# Patient Record
Sex: Female | Born: 1965
Health system: Southern US, Community
[De-identification: ages and names within clinical notes are randomized; demographics above are authoritative.]

## PROBLEM LIST (undated history)

## (undated) DIAGNOSIS — Z789 Other specified health status: Secondary | ICD-10-CM

## (undated) HISTORY — PX: NO PAST SURGERIES: SHX2092

---

## 1999-12-26 ENCOUNTER — Other Ambulatory Visit: Admission: RE | Admit: 1999-12-26 | Discharge: 1999-12-26 | Payer: Self-pay | Admitting: Family Medicine

## 2014-03-23 ENCOUNTER — Encounter (INDEPENDENT_AMBULATORY_CARE_PROVIDER_SITE_OTHER): Payer: Self-pay

## 2014-03-23 ENCOUNTER — Ambulatory Visit (INDEPENDENT_AMBULATORY_CARE_PROVIDER_SITE_OTHER): Payer: PRIVATE HEALTH INSURANCE | Admitting: Physician Assistant

## 2014-03-23 VITALS — BP 110/60 | HR 82 | Temp 96.8°F | Ht 60.0 in | Wt 108.8 lb

## 2014-03-23 DIAGNOSIS — H109 Unspecified conjunctivitis: Secondary | ICD-10-CM

## 2014-03-23 MED ORDER — TOBRAMYCIN 0.3 % OP SOLN: 2.0000 [drp] | Freq: Four times a day (QID) | OPHTHALMIC | Status: DC

## 2014-03-23 NOTE — Progress Notes (Signed)
Subjective:     Patient ID: Carrie BridgemanCathy Sutton Corkern, female   DOB: 06/24/1966, 48 y.o.   MRN: [id number]  HPI Redness and drainage to the L eye Pt works in a daycare setting  Review of Systems No visual changes + photophobia No pain    Objective:   Physical Exam PERRLA EOMI + erythema to the eyelids of the L eye No edema + matting to the lashes No pre-aur note    Assessment:     Conjunctivitis    Plan:     [employer] gtts [employer]q handwashing Work note F/U prn

## 2014-03-23 NOTE — Patient Instructions (Signed)

## 2015-12-18 ENCOUNTER — Encounter: Payer: PRIVATE HEALTH INSURANCE | Admitting: Family Medicine

## 2015-12-20 ENCOUNTER — Ambulatory Visit (INDEPENDENT_AMBULATORY_CARE_PROVIDER_SITE_OTHER): Payer: 59 | Admitting: Family Medicine

## 2015-12-20 ENCOUNTER — Encounter: Payer: Self-pay | Admitting: Family Medicine

## 2015-12-20 DIAGNOSIS — Z01419 Encounter for gynecological examination (general) (routine) without abnormal findings: Secondary | ICD-10-CM

## 2015-12-20 DIAGNOSIS — Z1211 Encounter for screening for malignant neoplasm of colon: Secondary | ICD-10-CM

## 2015-12-20 MED ORDER — CEPHALEXIN 500 MG PO CAPS: 500.0000 mg | ORAL_CAPSULE | Freq: Two times a day (BID) | ORAL | Status: DC

## 2015-12-20 NOTE — Progress Notes (Signed)
BP 130/86 mmHg  Pulse 77  Temp(Src) 98.4 F (36.9 C) (Oral)  Ht 5' (1.524 m)  Wt 106 lb 12.8 oz (48.444 kg)  BMI 20.86 kg/m2  LMP 03/14/2014   Subjective:    Patient ID: Carrie Mccoy, female    DOB: [birth year], 50 y.o.   MRN: [id number]  HPI: Carrie Mccoy is a 50 y.o. female presenting on 12/20/2015 for Gynecologic Exam   HPI Well woman exam Patient coming in today for well woman exam and gynecological exam. She has not had one in many years that she cannot even remember how long since his last one. She does not like coming to the doctor does not come very frequently. She would guess that it's been more than 10 years. She has never had an abnormal Pap but she's only had 2 in her lifetime. She denies any vaginal issues or pain or discharge or abnormal bleeding. She is not having periods anymore currently. She denies any chest pain, shortness of breath, headaches or vision issues, abdominal complaints, diarrhea, nausea, vomiting, or joint issues. She denies any issues with her breasts since his discharge or skin changes. She does say that she's always had fibrous breast tissue.  Relevant past medical, surgical, family and social history reviewed and updated as indicated. Interim medical history since our last visit reviewed. Allergies and medications reviewed and updated.  Review of Systems  Constitutional: Negative for fever and chills.  HENT: Negative for congestion, ear discharge and ear pain.   Eyes: Negative for redness and visual disturbance.  Respiratory: Negative for chest tightness and shortness of breath.   Cardiovascular: Negative for chest pain and leg swelling.  Genitourinary: Negative for dysuria, frequency, decreased urine volume, vaginal bleeding, vaginal discharge, difficulty urinating, genital sores, vaginal pain, menstrual problem and pelvic pain.  Musculoskeletal: Negative for back pain and gait problem.  Skin: Negative for color change and rash.    Neurological: Negative for light-headedness and headaches.  Psychiatric/Behavioral: Negative for behavioral problems and agitation.  All other systems reviewed and are negative.   Per HPI unless specifically indicated above     Medication List    Notice  As of 12/20/2015  3:56 PM   You have not been prescribed any medications.         Objective:    BP 130/86 mmHg  Pulse 77  Temp(Src) 98.4 F (36.9 C) (Oral)  Ht 5' (1.524 m)  Wt 106 lb 12.8 oz (48.444 kg)  BMI 20.86 kg/m2  LMP 03/14/2014  Wt Readings from Last 3 Encounters:  12/20/15 106 lb 12.8 oz (48.444 kg)  03/23/14 108 lb 12.8 oz (49.351 kg)    Physical Exam  Constitutional: She is oriented to person, place, and time. She appears well-developed and well-nourished. No distress.  HENT:  Right Ear: External ear normal.  Left Ear: External ear normal.  Nose: Nose normal.  Mouth/Throat: Oropharynx is clear and moist. No oropharyngeal exudate.  Eyes: Conjunctivae and EOM are normal. Pupils are equal, round, and reactive to light.  Neck: Neck supple. No thyromegaly present.  Cardiovascular: Normal rate, regular rhythm, normal heart sounds and intact distal pulses.   No murmur heard. Pulmonary/Chest: Effort normal and breath sounds normal. No respiratory distress. She has no wheezes. Right breast exhibits no inverted nipple, no mass, no nipple discharge, no skin change and no tenderness. Left breast exhibits no inverted nipple, no mass, no nipple discharge, no skin change and no tenderness. Breasts are symmetrical.  Abdominal: Soft. Bowel  sounds are normal. She exhibits no distension and no mass. There is no tenderness. There is no rebound and no guarding.  Genitourinary: Vagina normal and uterus normal. No breast swelling, tenderness, discharge or bleeding. Pelvic exam was performed with patient supine. There is no rash or lesion on the right labia. There is no rash or lesion on the left labia. Uterus is not deviated, not  enlarged, not fixed and not tender. Cervix exhibits no motion tenderness, no discharge and no friability. Right adnexum displays no mass and no tenderness. Left adnexum displays no mass and no tenderness.  Musculoskeletal: Normal range of motion. She exhibits no edema or tenderness.  Lymphadenopathy:    She has no cervical adenopathy.    She has no axillary adenopathy.  Neurological: She is alert and oriented to person, place, and time. Coordination normal.  Skin: Skin is warm and dry. No rash noted. She is not diaphoretic.  Psychiatric: She has a normal mood and affect. Her behavior is normal. Thought content normal.  Nursing note and vitals reviewed.   No results found for this or any previous visit.    Assessment & Plan:   Problem List Items Addressed This Visit    None    Visit Diagnoses    Well woman exam with routine gynecological exam        Relevant Orders    CMP14+EGFR    Lipid panel    Pap IG, rfx HPV all pth    Special screening for malignant neoplasms, colon        Relevant Orders    Ambulatory referral to Gastroenterology       Follow up plan: Return in about 1 year (around 12/19/2016), or if symptoms worsen or fail to improve.  Counseling provided for all of the vaccine components Orders Placed This Encounter  Procedures  . CMP14+EGFR  . Lipid panel  . Ambulatory referral to Gastroenterology    Caryl Pina, MD [facility] 12/20/2015, 3:56 PM

## 2015-12-21 LAB — LIPID PANEL
CHOLESTEROL TOTAL: 227 mg/dL — AB (ref 100–199)
Chol/HDL Ratio: 4.4 ratio units (ref 0.0–4.4)
HDL: 52 mg/dL (ref >39)
LDL Calculated: 127 mg/dL — ABNORMAL HIGH (ref 0–99)
TRIGLYCERIDES: 239 mg/dL — AB (ref 0–149)
VLDL Cholesterol Cal: 48 mg/dL — ABNORMAL HIGH (ref 5–40)

## 2015-12-21 LAB — CMP14+EGFR
A/G RATIO: 2.3 — AB (ref 1.2–2.2)
ALT: 12 IU/L (ref 0–32)
AST: 15 IU/L (ref 0–40)
Albumin: 4.9 g/dL (ref 3.5–5.5)
Alkaline Phosphatase: 67 IU/L (ref 39–117)
BILIRUBIN TOTAL: 0.3 mg/dL (ref 0.0–1.2)
BUN/Creatinine Ratio: 21 (ref 9–23)
BUN: 23 mg/dL (ref 6–24)
CHLORIDE: 101 mmol/L (ref 96–106)
CO2: 27 mmol/L (ref 18–29)
CREATININE: 1.08 mg/dL — AB (ref 0.57–1.00)
Calcium: 9.5 mg/dL (ref 8.7–10.2)
GFR calc Af Amer: 69 mL/min/{1.73_m2} (ref >59)
GFR calc non Af Amer: 60 mL/min/{1.73_m2} (ref >59)
GLOBULIN, TOTAL: 2.1 g/dL (ref 1.5–4.5)
Glucose: 86 mg/dL (ref 65–99)
Potassium: 4.5 mmol/L (ref 3.5–5.2)
SODIUM: 144 mmol/L (ref 134–144)
Total Protein: 7 g/dL (ref 6.0–8.5)

## 2015-12-23 LAB — PAP IG, RFX HPV ALL PTH: PAP Smear Comment: 0

## 2015-12-27 ENCOUNTER — Encounter: Payer: Self-pay | Admitting: *Deleted

## 2016-01-08 ENCOUNTER — Telehealth: Payer: Self-pay

## 2016-01-08 NOTE — Telephone Encounter (Signed)
Pt had received a triage letter from [facility]. Please call [phone]

## 2016-01-16 ENCOUNTER — Telehealth: Payer: Self-pay

## 2016-01-16 NOTE — Telephone Encounter (Signed)
LMOM to call.

## 2016-01-29 ENCOUNTER — Other Ambulatory Visit: Payer: Self-pay

## 2016-01-29 DIAGNOSIS — Z1211 Encounter for screening for malignant neoplasm of colon: Secondary | ICD-10-CM

## 2016-01-29 MED ORDER — SOD PICOSULFATE-MAG OX-CIT ACD 10-3.5-12 MG-GM-GM PO PACK: 1.0000 | PACK | ORAL | Status: DC

## 2016-01-29 NOTE — Telephone Encounter (Signed)
Rx sent to the pharmacy.

## 2016-01-29 NOTE — Telephone Encounter (Signed)
See separate triage.  

## 2016-01-29 NOTE — Telephone Encounter (Signed)
Gastroenterology Pre-Procedure Review  Request Date: 01/16/2016 Requesting Physician: Dr. Ivin BootyJoshua Dettinger  PATIENT REVIEW QUESTIONS: The patient responded to the following health history questions as indicated:    1. Diabetes Melitis: no 2. Joint replacements in the past 12 months: no 3. Major health problems in the past 3 months: no 4. Has an artificial valve or MVP: no 5. Has a defibrillator: no 6. Has been advised in past to take antibiotics in advance of a procedure like teeth cleaning: no 7. Family history of colon cancer: no  8. Alcohol Use: no 9. History of sleep apnea: no     MEDICATIONS & ALLERGIES:    Patient reports the following regarding taking any blood thinners:   Plavix? no Aspirin? no Coumadin? no  Patient confirms/reports the following medications:  Current Outpatient Prescriptions  Medication Sig Dispense Refill  . Multiple Vitamin (MULTIVITAMIN) tablet Take 1 tablet by mouth daily.    . NON FORMULARY Vitamin B    One daily    . NON FORMULARY Vitamin D    One daily     No current facility-administered medications for this visit.    Patient confirms/reports the following allergies:  No Known Allergies  No orders of the defined types were placed in this encounter.    AUTHORIZATION INFORMATION Primary Insurance:   ID #: Group #:  Pre-Cert / Auth required:  Pre-Cert / Auth #:   Secondary Insurance:   ID #:   Group #:  Pre-Cert / Auth required: Pre-Cert / Auth #:   SCHEDULE INFORMATION: Procedure has been scheduled as follows:  Date:  02/02/2016            Time: 1:15 PM  Location: [facility] Short Stay  This Gastroenterology Pre-Precedure Review Form is being routed to the following provider(s): Jonette EvaSandi Fields, MD

## 2016-01-29 NOTE — Telephone Encounter (Signed)
PREPOPIK-DRINK WATER TO KEEP URINE LIGHT YELLOW.  FULL LIQUIDS WITH BREAKFAST.  Full Liquid Diet A high-calorie, high-protein supplement should be used to meet your nutritional requirements when the full liquid diet is continued for more than 2 or 3 days. If this diet is to be used for an extended period of time (more than 7 days), a multivitamin should be considered.  Breads and Starches  Allowed: None are allowed    Avoid: Any others.    Potatoes/Pasta/Rice  Allowed: ANY ITEM AS A SOUP OR SMALL PLATE OF MASHED POTATOES OR RICE.       Vegetables  Allowed: Strained tomato or vegetable juice. Vegetables pureed in soup.   Avoid: Any others.    Fruit  Allowed: Any strained fruit juices and fruit drinks. Include 1 serving of citrus or vitamin C-enriched fruit juice daily.   Avoid: Any others.  Meat and Meat Substitutes  Allowed: Egg  Avoid: Any meat, fish, or fowl. All cheese.  Milk  Allowed: SOY Milk beverages, including milk shakes and instant breakfast mixes. Smooth yogurt.   Avoid: Any others. Avoid dairy products if not tolerated.    Soups and Combination Foods  Allowed: Broth, strained cream soups. Strained, broth-based soups.   Avoid: Any others.    Desserts and Sweets  Allowed: flavored gelatin, tapioca, plain LACTOSE FREE ice cream, sherbet, smooth pudding, junket, fruit ices, frozen ice pops, pudding pops,, frozen fudge pops, chocolate syrup. Sugar, honey, jelly, syrup.   Avoid: Any others.  Fats and Oils  Allowed: Margarine, butter, cream, sour cream, oils.   Avoid: Any others.  Beverages  Allowed: All.   Avoid: None.  Condiments  Allowed: Iodized salt, pepper, spices, flavorings. Cocoa powder.   Avoid: Any others.    SAMPLE MEAL PLAN Breakfast   cup orange juice.   1 cup cooked wheat cereal.   1 cup SOY milk.   1 cup beverage (coffee or tea).   Cream or sugar, if desired.    Midmorning Snack  2 SCRAMBLED OR HARD BOILED  EGG   Lunch  1 cup cream soup.    cup fruit juice.   1 cup SOY milk.    cup custard.   1 cup beverage (coffee or tea).   Cream or sugar, if desired.    Midafternoon Snack  1 cup VANILLA SOY milk shake.  Dinner  1 cup cream soup.    cup fruit juice.   1 cup SOY MILK milk.    cup pudding.   1 cup beverage (coffee or tea).   Cream or sugar, if desired.  Evening Snack  1 cup supplement.  To increase calories, add sugar, cream, butter, or margarine if possible. Nutritional supplements will also increase the total calories.;L;

## 2016-01-29 NOTE — Telephone Encounter (Signed)
Instructions mailed to pt. LMOM for a return call.  If pt needs this faxed to a number I will be glad to do so.

## 2016-01-30 ENCOUNTER — Telehealth: Payer: Self-pay

## 2016-01-30 NOTE — Telephone Encounter (Signed)
Pt to let us know if she needs it faxed.

## 2016-01-30 NOTE — Telephone Encounter (Signed)
I called [employer] @ [phone] and spoke to TerrilJoan I who said a PA is required for the screening colonoscopy as out patient.  Reference # [id number].

## 2016-01-31 ENCOUNTER — Telehealth: Payer: Self-pay

## 2016-01-31 NOTE — Telephone Encounter (Signed)
Pt called today because she does not have any instructions for her prep. She is set up for Friday. I went over the instructions with her and she understood them. She is going to be doing the split prep since her TCS is at 1:15.

## 2016-02-01 NOTE — Telephone Encounter (Signed)
Noted  

## 2016-02-02 ENCOUNTER — Encounter (HOSPITAL_COMMUNITY): Payer: Self-pay | Admitting: *Deleted

## 2016-02-02 ENCOUNTER — Ambulatory Visit (HOSPITAL_COMMUNITY)
Admission: RE | Admit: 2016-02-02 | Discharge: 2016-02-02 | Disposition: A | Discharge status: Home / Self Care | Payer: 59 | Source: Ambulatory Visit | Attending: Gastroenterology | Admitting: Gastroenterology

## 2016-02-02 ENCOUNTER — Encounter (HOSPITAL_COMMUNITY)
Admission: RE | Disposition: A | Discharge status: Home / Self Care | Payer: Self-pay | Source: Ambulatory Visit | Attending: Gastroenterology

## 2016-02-02 DIAGNOSIS — K648 Other hemorrhoids: Secondary | ICD-10-CM | POA: Insufficient documentation

## 2016-02-02 DIAGNOSIS — Z1211 Encounter for screening for malignant neoplasm of colon: Secondary | ICD-10-CM | POA: Insufficient documentation

## 2016-02-02 DIAGNOSIS — Q438 Other specified congenital malformations of intestine: Secondary | ICD-10-CM | POA: Diagnosis not present

## 2016-02-02 HISTORY — DX: Other specified health status: Z78.9

## 2016-02-02 HISTORY — PX: COLONOSCOPY: SHX5424

## 2016-02-02 SURGERY — COLONOSCOPY
Anesthesia: Moderate Sedation

## 2016-02-02 MED ORDER — SODIUM CHLORIDE 0.9 % IV SOLN
INTRAVENOUS | Status: DC
  Administered 2016-02-02: 1000 mL via INTRAVENOUS

## 2016-02-02 MED ORDER — MIDAZOLAM HCL 5 MG/5ML IJ SOLN
INTRAMUSCULAR | Status: DC | PRN
  Administered 2016-02-02 (×2): 2 mg via INTRAVENOUS
  Administered 2016-02-02: 1 mg via INTRAVENOUS

## 2016-02-02 MED ORDER — MIDAZOLAM HCL 5 MG/5ML IJ SOLN
INTRAMUSCULAR | Status: AC
  Filled 2016-02-02: qty 10

## 2016-02-02 MED ORDER — MEPERIDINE HCL 100 MG/ML IJ SOLN
INTRAMUSCULAR | Status: DC | PRN
  Administered 2016-02-02 (×2): 25 mg

## 2016-02-02 MED ORDER — MEPERIDINE HCL 100 MG/ML IJ SOLN
INTRAMUSCULAR | Status: AC
  Filled 2016-02-02: qty 2

## 2016-02-02 MED ORDER — STERILE WATER FOR IRRIGATION IR SOLN
Status: DC | PRN
  Administered 2016-02-02: 13:00:00

## 2016-02-02 NOTE — H&P (Signed)
  Primary Care Physician:  Nils PyleJoshua A Dettinger, MD Primary Gastroenterologist:  Dr. Darrick PennaFields  Pre-Procedure History & Physical: HPI:  Carrie Mccoy is a 50 y.o. female here for COLON CANCER SCREENING.  Past Medical History  Diagnosis Date  . Medical history non-contributory     Past Surgical History  Procedure Laterality Date  . No past surgeries      Prior to Admission medications   Medication Sig Start Date End Date Taking? Authorizing Provider  Sod Picosulfate-Mag Ox-Cit Acd 10-3.5-12 MG-GM-GM PACK Take 1 Container by mouth as directed. 01/29/16  Yes West BaliSandi L Kemal Amores, MD  Cyanocobalamin (VITAMIN B-12 PO) Take 1 tablet by mouth daily.    Historical Provider, MD  Multiple Vitamin (MULTIVITAMIN) tablet Take 1 tablet by mouth daily.    Historical Provider, MD  NON FORMULARY Vitamin B    One daily    Historical Provider, MD  NON FORMULARY Vitamin D    One daily    Historical Provider, MD  VITAMIN E PO Take 1 tablet by mouth daily.    Historical Provider, MD    Allergies as of 01/29/2016  . (No Known Allergies)    Family History  Problem Relation Age of Onset  . Hyperlipidemia Mother   . Heart disease Mother   . Heart disease Father   . Stroke Father   . Aneurysm Father     Social History   Social History  . Marital Status: Married    Spouse Name: N/A  . Number of Children: N/A  . Years of Education: N/A   Occupational History  . Not on file.   Social History Main Topics  . Smoking status: Never Smoker   . Smokeless tobacco: Never Used  . Alcohol Use: No  . Drug Use: No  . Sexual Activity: Not on file   Other Topics Concern  . Not on file   Social History Narrative    Review of Systems: See HPI, otherwise negative ROS   Physical Exam: BP 132/89 mmHg  Pulse 74  Temp(Src) 97.6 F (36.4 C) (Oral)  Resp 12  Ht 5' (1.524 m)  Wt 107 lb (48.535 kg)  BMI 20.90 kg/m2  SpO2 99%  LMP 03/14/2014 General:   Alert,  pleasant and cooperative in NAD Head:   Normocephalic and atraumatic. Neck:  Supple; Lungs:  Clear throughout to auscultation.    Heart:  Regular rate and rhythm. Abdomen:  Soft, nontender and nondistended. Normal bowel sounds, without guarding, and without rebound.   Neurologic:  Alert and  oriented x4;  grossly normal neurologically.  Impression/Plan:     SCREENING  Plan:  1. TCS TODAY

## 2016-02-02 NOTE — Discharge Instructions (Signed)
You have internal hemorrhoids. YOU DID NOT HAVE ANY POLYPS.    FOLLOW A HIGH FIBER DIET. AVOID ITEMS THAT CAUSE BLOATING. SEE INFO BELOW.  USE PREPARATION H 2 TO 4 TIMES A DAY AS NEEDED FOR RECTAL PRESSURE/PAIN/BLEEDING.  Next colonoscopy in 10 years.   Colonoscopy Care After Read the instructions outlined below and refer to this sheet in the next week. These discharge instructions provide you with general information on caring for yourself after you leave the hospital. While your treatment has been planned according to the most current medical practices available, unavoidable complications occasionally occur. If you have any problems or questions after discharge, call DR. Schneur Crowson, [phone].  ACTIVITY  You may resume your regular activity, but move at a slower pace for the next 24 hours.   Take frequent rest periods for the next 24 hours.   Walking will help get rid of the air and reduce the bloated feeling in your belly (abdomen).   No driving for 24 hours (because of the medicine (anesthesia) used during the test).   You may shower.   Do not sign any important legal documents or operate any machinery for 24 hours (because of the anesthesia used during the test).    NUTRITION  Drink plenty of fluids.   You may resume your normal diet as instructed by your doctor.   Begin with a light meal and progress to your normal diet. Heavy or fried foods are harder to digest and may make you feel sick to your stomach (nauseated).   Avoid alcoholic beverages for 24 hours or as instructed.    MEDICATIONS  You may resume your normal medications.   WHAT YOU CAN EXPECT TODAY  Some feelings of bloating in the abdomen.   Passage of more gas than usual.   Spotting of blood in your stool or on the toilet paper  .  IF YOU HAD POLYPS REMOVED DURING THE COLONOSCOPY:  Eat a soft diet IF YOU HAVE NAUSEA, BLOATING, ABDOMINAL PAIN, OR VOMITING.    FINDING OUT THE RESULTS OF YOUR  TEST Not all test results are available during your visit. DR. Darrick PennaFIELDS WILL CALL YOU WITHIN 7 DAYS OF YOUR PROCEDUE WITH YOUR RESULTS. Do not assume everything is normal if you have not heard from DR. Rahmir Beever IN ONE WEEK, CALL HER OFFICE AT [phone].  SEEK IMMEDIATE MEDICAL ATTENTION AND CALL THE OFFICE: [phone] IF:  You have more than a spotting of blood in your stool.   Your belly is swollen (abdominal distention).   You are nauseated or vomiting.   You have a temperature over 101F.   You have abdominal pain or discomfort that is severe or gets worse throughout the day.  High-Fiber Diet A high-fiber diet changes your normal diet to include more whole grains, legumes, fruits, and vegetables. Changes in the diet involve replacing refined carbohydrates with unrefined foods. The calorie level of the diet is essentially unchanged. The Dietary Reference Intake (recommended amount) for adult males is 38 grams per day. For adult females, it is 25 grams per day. Pregnant and lactating women should consume 28 grams of fiber per day. Fiber is the intact part of a plant that is not broken down during digestion. Functional fiber is fiber that has been isolated from the plant to provide a beneficial effect in the body. PURPOSE  Increase stool bulk.   Ease and regulate bowel movements.   Lower cholesterol.  INDICATIONS THAT YOU NEED MORE FIBER  Constipation and hemorrhoids.  Uncomplicated diverticulosis (intestine condition) and irritable bowel syndrome.  °· Weight management.  °· As a protective measure against hardening of the arteries (atherosclerosis), diabetes, and cancer.  ° °GUIDELINES FOR INCREASING FIBER IN THE DIET °· Start adding fiber to the diet slowly. A gradual increase of about 5 more grams (2 slices of whole-wheat bread, 2 servings of most fruits or vegetables, or 1 bowl of high-fiber cereal) per day is best. Too rapid an increase in fiber may result in constipation,  flatulence, and bloating.  °· Drink enough water and fluids to keep your urine clear or pale yellow. Water, juice, or caffeine-free drinks are recommended. Not drinking enough fluid may cause constipation.  °· Eat a variety of high-fiber foods rather than one type of fiber.  °· Try to increase your intake of fiber through using high-fiber foods rather than fiber pills or supplements that contain small amounts of fiber.  °· The goal is to change the types of food eaten. Do not supplement your present diet with high-fiber foods, but replace foods in your present diet.  °INCLUDE A VARIETY OF FIBER SOURCES °· Replace refined and processed grains with whole grains, canned fruits with fresh fruits, and incorporate other fiber sources. White rice, white breads, and most bakery goods contain little or no fiber.  °· Brown whole-grain rice, buckwheat oats, and many fruits and vegetables are all good sources of fiber. These include: broccoli, Brussels sprouts, cabbage, cauliflower, beets, sweet potatoes, white potatoes (skin on), carrots, tomatoes, eggplant, squash, berries, fresh fruits, and dried fruits.  °· Cereals appear to be the richest source of fiber. Cereal fiber is found in whole grains and bran. Bran is the fiber-rich outer coat of cereal grain, which is largely removed in refining. In whole-grain cereals, the bran remains. In breakfast cereals, the largest amount of fiber is found in those with "bran" in their names. The fiber content is sometimes indicated on the label.  °· You may need to include additional fruits and vegetables each day.  °· In baking, for 1 cup white flour, you may use the following substitutions:  °· 1 cup whole-wheat flour minus 2 tablespoons.  °· 1/2 cup white flour plus 1/2 cup whole-wheat flour.  ° °Hemorrhoids °Hemorrhoids are dilated (enlarged) veins around the rectum. Sometimes clots will form in the veins. This makes them swollen and painful. These are called thrombosed  hemorrhoids. °Causes of hemorrhoids include: °· Constipation.  °· Straining to have a bowel movement. °·  HEAVY LIFTING °HOME CARE INSTRUCTIONS °· Eat a well balanced diet and drink 6 to 8 glasses of water every day to avoid constipation. You may also use a bulk laxative.  °· Avoid straining to have bowel movements.  °· Keep anal area dry and clean.  °· Do not use a donut shaped pillow or sit on the toilet for long periods. This increases blood pooling and pain.  °· Move your bowels when your body has the urge; this will require less straining and will decrease pain and pressure.  ° °

## 2016-02-03 NOTE — Op Note (Signed)
[facility] Patient Name: Carrie Mccoy Procedure Date: 02/02/2016 1:15 PM MRN: [id number] Date of Birth: [birth year] Attending MD: Jonette Eva , MD CSN: [id number] Age: 50 Admit Type: Outpatient Procedure:                Colonoscopy Indications:              Screening for colorectal malignant neoplasm Providers:                Jonette Eva, MD, Jannett Celestine, RN, Birder Robson,                            Technician Referring MD:              Medicines:                Meperidine 50 mg IV, Midazolam 5 mg IV Complications:            No immediate complications. Estimated Blood Loss:     Estimated blood loss was minimal. Procedure:                Pre-Anesthesia Assessment:                           - Prior to the procedure, a History and Physical                            was performed, and patient medications and                            allergies were reviewed. The patient's tolerance of                            previous anesthesia was also reviewed. The risks                            and benefits of the procedure and the sedation                            options and risks were discussed with the patient.                            All questions were answered, and informed consent                            was obtained. Prior Anticoagulants: The patient has                            taken no previous anticoagulant or antiplatelet                            agents. ASA Grade Assessment: I - A normal, healthy                            patient. After reviewing the risks and benefits,  the patient was deemed in satisfactory condition to                            undergo the procedure.                           After obtaining informed consent, the colonoscope                            was passed under direct vision. Throughout the                            procedure, the patient's blood pressure, pulse, and                            oxygen  saturations were monitored continuously. The                            [id number] ([id number]) scope was introduced through                            the anus and advanced to the the cecum, identified                            by appendiceal orifice and ileocecal valve. The                            ileocecal valve, appendiceal orifice, and rectum                            were photographed. The colonoscopy was somewhat                            difficult due to a tortuous colon. Successful                            completion of the procedure was aided by increasing                            the dose of sedation medication. The patient                            tolerated the procedure well. The quality of the                            bowel preparation was excellent. Scope In: 1:34:06 PM Scope Out: 1:47:16 PM Scope Withdrawal Time: 0 hours 9 minutes 34 seconds  Total Procedure Duration: 0 hours 13 minutes 10 seconds  Findings:      The recto-sigmoid colon was mildly redundant. Advancing the scope       required straightening and shortening the scope to obtain bowel loop       reduction.      Non-bleeding internal hemorrhoids were found. The hemorrhoids were small. Impression:               -  Redundant colon.                           - Non-bleeding internal hemorrhoids.                           - No specimens collected. Moderate Sedation:      Moderate (conscious) sedation was administered by the endoscopy nurse       and supervised by the endoscopist. The following parameters were       monitored: oxygen saturation, heart rate, blood pressure, and response       to care. Total physician intraservice time was 26 minutes. Recommendation:           - Repeat colonoscopy in 10 years for surveillance.                           - High fiber diet.                           - Continue present medications.                           - Patient has a contact number available for                             emergencies. The signs and symptoms of potential                            delayed complications were discussed with the                            patient. Return to normal activities tomorrow.                            Written discharge instructions were provided to the                            patient. Procedure Code(s):        --- Professional ---                           Y7829G0121, Colorectal cancer screening; colonoscopy on                            individual not meeting criteria for high risk                           99153, Moderate sedation services; each additional                            15 minutes intraservice time                           G0500, Moderate sedation services provided by the  same physician or other qualified health care                            professional performing a gastrointestinal                            endoscopic service that sedation supports,                            requiring the presence of an independent trained                            observer to assist in the monitoring of the                            patient's level of consciousness and physiological                            status; initial 15 minutes of intra-service time;                            patient age 3 years or older (additional time may                            be reported with 09604, as appropriate) Diagnosis Code(s):        --- Professional ---                           Z12.11, Encounter for screening for malignant                            neoplasm of colon                           K64.8, Other hemorrhoids                           Q43.8, Other specified congenital malformations of                            intestine CPT copyright 2016 [employer]. All rights reserved. The codes documented in this report are preliminary and upon coder review may  be revised to meet current compliance  requirements. Jonette Eva, MD Jonette Eva, MD 02/02/2016 11:23:42 PM This report has been signed electronically. Number of Addenda: 0

## 2016-02-07 ENCOUNTER — Encounter (HOSPITAL_COMMUNITY): Payer: Self-pay | Admitting: Gastroenterology

## 2016-03-18 ENCOUNTER — Encounter: Payer: PRIVATE HEALTH INSURANCE | Admitting: *Deleted

## 2016-11-08 ENCOUNTER — Telehealth: Payer: Self-pay

## 2016-11-18 NOTE — Telephone Encounter (Signed)
x

## 2017-06-27 ENCOUNTER — Ambulatory Visit (INDEPENDENT_AMBULATORY_CARE_PROVIDER_SITE_OTHER): Payer: 59 | Admitting: Family Medicine

## 2017-06-27 ENCOUNTER — Encounter: Payer: Self-pay | Admitting: Family Medicine

## 2017-06-27 VITALS — BP 125/86 | HR 76 | Temp 98.0°F | Ht 60.0 in | Wt 111.0 lb

## 2017-06-27 DIAGNOSIS — Z13220 Encounter for screening for lipoid disorders: Secondary | ICD-10-CM | POA: Diagnosis not present

## 2017-06-27 DIAGNOSIS — Z131 Encounter for screening for diabetes mellitus: Secondary | ICD-10-CM

## 2017-06-27 DIAGNOSIS — Z0000 Encounter for general adult medical examination without abnormal findings: Secondary | ICD-10-CM | POA: Diagnosis not present

## 2017-06-27 NOTE — Progress Notes (Signed)
BP 125/86   Pulse 76   Temp 98 F (36.7 C) (Oral)   Ht 5' (1.524 m)   Wt 111 lb (50.3 kg)   LMP 03/14/2014   BMI 21.68 kg/m    Subjective:    Patient ID: Carrie Mccoy, female    DOB: October 16, 1965, 51 y.o.   MRN: [id number]  HPI: Carrie Mccoy is a 51 y.o. female presenting on 06/27/2017 for Annual Exam   HPI Annual well adult exam and physical Patient is coming in today for adult well exam and physical and fasting labs. She is not due for Pap smear this year because she has had one just last year. She also had a mammogram just year and half ago which was normal. She is due for that soon as well. She denies any major health issues. Patient denies any chest pain, shortness of breath, headaches or vision issues, abdominal complaints, diarrhea, nausea, vomiting, or joint issues.   Relevant past medical, surgical, family and social history reviewed and updated as indicated. Interim medical history since our last visit reviewed. Allergies and medications reviewed and updated.  Review of Systems  Constitutional: Negative for chills and fever.  HENT: Negative for congestion, ear discharge, ear pain and tinnitus.   Eyes: Negative for pain, redness and visual disturbance.  Respiratory: Negative for cough, chest tightness, shortness of breath and wheezing.   Cardiovascular: Negative for chest pain, palpitations and leg swelling.  Gastrointestinal: Negative for abdominal pain, blood in stool, constipation and diarrhea.  Genitourinary: Negative for difficulty urinating, dysuria and hematuria.  Musculoskeletal: Negative for back pain, gait problem and myalgias.  Skin: Negative for rash.  Neurological: Negative for dizziness, weakness, light-headedness and headaches.  Psychiatric/Behavioral: Negative for agitation, behavioral problems and suicidal ideas.  All other systems reviewed and are negative.   Per HPI unless specifically indicated above        Objective:    BP 125/86    Pulse 76   Temp 98 F (36.7 C) (Oral)   Ht 5' (1.524 m)   Wt 111 lb (50.3 kg)   LMP 03/14/2014   BMI 21.68 kg/m   Wt Readings from Last 3 Encounters:  06/27/17 111 lb (50.3 kg)  02/02/16 107 lb (48.5 kg)  12/20/15 106 lb 12.8 oz (48.4 kg)    Physical Exam  Constitutional: She is oriented to person, place, and time. She appears well-developed and well-nourished. No distress.  Eyes: Conjunctivae are normal.  Neck: Neck supple. No thyromegaly present.  Cardiovascular: Normal rate, regular rhythm, normal heart sounds and intact distal pulses.   No murmur heard. Pulmonary/Chest: Effort normal and breath sounds normal. No respiratory distress. She has no wheezes.  Abdominal: Soft. Bowel sounds are normal. She exhibits no distension. There is no tenderness. There is no rebound.  Musculoskeletal: Normal range of motion. She exhibits no edema or tenderness.  Lymphadenopathy:    She has no cervical adenopathy.  Neurological: She is alert and oriented to person, place, and time. Coordination normal.  Skin: Skin is warm and dry. Lesion (Seborrheic keratosis on midaxillary line on the left thorax) noted. No rash noted. She is not diaphoretic.  Psychiatric: She has a normal mood and affect. Her behavior is normal.  Nursing note and vitals reviewed.       Assessment & Plan:   Problem List Items Addressed This Visit    None    Visit Diagnoses    Well adult exam    -  Primary  Relevant Orders   CMP14+EGFR   CBC with Differential/Platelet   Lipid panel   Lipid screening       Relevant Orders   Lipid panel   Diabetes mellitus screening       Relevant Orders   CMP14+EGFR       Follow up plan: Return in about 1 year (around 06/27/2018), or if symptoms worsen or fail to improve.  Counseling provided for all of the vaccine components Orders Placed This Encounter  Procedures  . CMP14+EGFR  . CBC with Differential/Platelet  . Lipid panel    Caryl Pina, MD [facility] 06/27/2017, 3:20 PM

## 2017-06-28 LAB — CBC WITH DIFFERENTIAL/PLATELET
BASOS ABS: 0 10*3/uL (ref 0.0–0.2)
Basos: 0 %
EOS (ABSOLUTE): 0.1 10*3/uL (ref 0.0–0.4)
Eos: 2 %
HEMOGLOBIN: 12.4 g/dL (ref 11.1–15.9)
Hematocrit: 36.4 % (ref 34.0–46.6)
Immature Grans (Abs): 0 10*3/uL (ref 0.0–0.1)
Immature Granulocytes: 0 %
LYMPHS ABS: 2 10*3/uL (ref 0.7–3.1)
Lymphs: 32 %
MCH: 29.5 pg (ref 26.6–33.0)
MCHC: 34.1 g/dL (ref 31.5–35.7)
MCV: 87 fL (ref 79–97)
MONOCYTES: 8 %
Monocytes Absolute: 0.5 10*3/uL (ref 0.1–0.9)
NEUTROS PCT: 58 %
Neutrophils Absolute: 3.7 10*3/uL (ref 1.4–7.0)
Platelets: 206 10*3/uL (ref 150–379)
RBC: 4.21 x10E6/uL (ref 3.77–5.28)
RDW: 13.1 % (ref 12.3–15.4)
WBC: 6.3 10*3/uL (ref 3.4–10.8)

## 2017-06-28 LAB — CMP14+EGFR
ALK PHOS: 70 IU/L (ref 39–117)
ALT: 6 IU/L (ref 0–32)
AST: 13 IU/L (ref 0–40)
Albumin/Globulin Ratio: 2.3 — ABNORMAL HIGH (ref 1.2–2.2)
Albumin: 4.5 g/dL (ref 3.5–5.5)
BILIRUBIN TOTAL: 0.3 mg/dL (ref 0.0–1.2)
BUN/Creatinine Ratio: 24 — ABNORMAL HIGH (ref 9–23)
BUN: 18 mg/dL (ref 6–24)
CHLORIDE: 105 mmol/L (ref 96–106)
CO2: 26 mmol/L (ref 20–29)
Calcium: 9.2 mg/dL (ref 8.7–10.2)
Creatinine, Ser: 0.75 mg/dL (ref 0.57–1.00)
GFR calc Af Amer: 107 mL/min/{1.73_m2} (ref >59)
GFR calc non Af Amer: 93 mL/min/{1.73_m2} (ref >59)
GLUCOSE: 84 mg/dL (ref 65–99)
Globulin, Total: 2 g/dL (ref 1.5–4.5)
Potassium: 4.2 mmol/L (ref 3.5–5.2)
Sodium: 146 mmol/L — ABNORMAL HIGH (ref 134–144)
Total Protein: 6.5 g/dL (ref 6.0–8.5)

## 2017-06-28 LAB — LIPID PANEL
CHOL/HDL RATIO: 4.6 ratio — AB (ref 0.0–4.4)
CHOLESTEROL TOTAL: 215 mg/dL — AB (ref 100–199)
HDL: 47 mg/dL (ref >39)
LDL Calculated: 117 mg/dL — ABNORMAL HIGH (ref 0–99)
TRIGLYCERIDES: 253 mg/dL — AB (ref 0–149)
VLDL Cholesterol Cal: 51 mg/dL — ABNORMAL HIGH (ref 5–40)

## 2017-06-30 ENCOUNTER — Telehealth: Payer: Self-pay | Admitting: Family Medicine

## 2017-06-30 NOTE — Telephone Encounter (Signed)
Patient notified. See lab note 

## 2018-06-29 ENCOUNTER — Encounter: Payer: Self-pay | Admitting: Family Medicine

## 2018-06-29 ENCOUNTER — Ambulatory Visit (INDEPENDENT_AMBULATORY_CARE_PROVIDER_SITE_OTHER): Payer: 59 | Admitting: Family Medicine

## 2018-06-29 VITALS — BP 130/80 | HR 83 | Temp 97.5°F | Ht 60.0 in | Wt 110.6 lb

## 2018-06-29 DIAGNOSIS — R5383 Other fatigue: Secondary | ICD-10-CM

## 2018-06-29 DIAGNOSIS — E785 Hyperlipidemia, unspecified: Secondary | ICD-10-CM | POA: Insufficient documentation

## 2018-06-29 DIAGNOSIS — E782 Mixed hyperlipidemia: Secondary | ICD-10-CM

## 2018-06-29 DIAGNOSIS — Z0000 Encounter for general adult medical examination without abnormal findings: Secondary | ICD-10-CM | POA: Diagnosis not present

## 2018-06-29 NOTE — Progress Notes (Signed)
BP 130/80   Pulse 83   Temp (!) 97.5 F (36.4 C) (Oral)   Ht 5' (1.524 m)   Wt 110 lb 9.6 oz (50.2 kg)   LMP 03/14/2014   BMI 21.60 kg/m    Subjective:    Patient ID: Carrie Mccoy, female    DOB: [birth year], 52 y.o.   MRN: [id number]  HPI: Carrie Mccoy is a 52 y.o. female presenting on 06/29/2018 for Annual Exam   HPI Well adult exam and physical Patient is coming in today for well adult exam and physical.  She says she gets an occasional fatigue every now and then that she thinks could be related to taking all her vitamins but does not know for sure but is not every day and has not all the time.  She says she sleeps well and she feels fine on the days that she does not take her vitamins she thinks.  She denies any other factors that have played into it.  She currently works still at a daycare and enjoys it very much and stays active that way.  She denies any other health issues or concerns. Patient denies any chest pain, shortness of breath, headaches or vision issues, abdominal complaints, diarrhea, nausea, vomiting, or joint issues.   Relevant past medical, surgical, family and social history reviewed and updated as indicated. Interim medical history since our last visit reviewed. Allergies and medications reviewed and updated.  Review of Systems  Constitutional: Negative for chills and fever.  HENT: Negative for congestion, ear discharge, ear pain and tinnitus.   Eyes: Negative for pain, redness and visual disturbance.  Respiratory: Negative for cough, chest tightness, shortness of breath and wheezing.   Cardiovascular: Negative for chest pain, palpitations and leg swelling.  Gastrointestinal: Negative for abdominal pain, blood in stool, constipation and diarrhea.  Genitourinary: Negative for difficulty urinating, dysuria and hematuria.  Musculoskeletal: Negative for back pain, gait problem and myalgias.  Skin: Negative for rash.  Neurological: Negative for dizziness,  weakness, light-headedness and headaches.  Psychiatric/Behavioral: Negative for agitation, behavioral problems and suicidal ideas.  All other systems reviewed and are negative.   Per HPI unless specifically indicated above   Allergies as of 06/29/2018   No Known Allergies     Medication List        Accurate as of 06/29/18  3:31 PM. Always use your most recent med list.          multivitamin tablet Take 1 tablet by mouth daily.   NON FORMULARY Vitamin B    One daily   NON FORMULARY Vitamin D    One daily   VITAMIN B-12 PO Take 1 tablet by mouth daily.   VITAMIN E PO Take 1 tablet by mouth daily.          Objective:    BP 130/80   Pulse 83   Temp (!) 97.5 F (36.4 C) (Oral)   Ht 5' (1.524 m)   Wt 110 lb 9.6 oz (50.2 kg)   LMP 03/14/2014   BMI 21.60 kg/m   Wt Readings from Last 3 Encounters:  06/29/18 110 lb 9.6 oz (50.2 kg)  06/27/17 111 lb (50.3 kg)  02/02/16 107 lb (48.5 kg)    Physical Exam  Constitutional: She is oriented to person, place, and time. She appears well-developed and well-nourished. No distress.  Eyes: Conjunctivae are normal.  Neck: Neck supple. No thyromegaly present.  Cardiovascular: Normal rate, regular rhythm, normal heart sounds and intact distal  pulses.  No murmur heard. Pulmonary/Chest: Effort normal and breath sounds normal. No respiratory distress. She has no wheezes.  Musculoskeletal: Normal range of motion. She exhibits no edema or tenderness.  Lymphadenopathy:    She has no cervical adenopathy.  Neurological: She is alert and oriented to person, place, and time. Coordination normal.  Skin: Skin is warm and dry. No rash noted. She is not diaphoretic.  Psychiatric: She has a normal mood and affect. Her behavior is normal.  Nursing note and vitals reviewed.       Assessment & Plan:   Problem List Items Addressed This Visit      Other   Hyperlipidemia    Other Visit Diagnoses    Well adult exam    -  Primary    Relevant Orders   BMP8+EGFR   Lipid panel   Other fatigue       Relevant Orders   CBC with Differential/Platelet   TSH       Follow up plan: Return in about 1 year (around 06/30/2019), or if symptoms worsen or fail to improve, for Well adult exam.  Counseling provided for all of the vaccine components Orders Placed This Encounter  Procedures  . CBC with Differential/Platelet  . BMP8+EGFR  . Lipid panel  . TSH    Caryl Pina, MD [facility] 06/29/2018, 3:31 PM

## 2018-06-30 LAB — CBC WITH DIFFERENTIAL/PLATELET
BASOS ABS: 0 10*3/uL (ref 0.0–0.2)
Basos: 0 %
EOS (ABSOLUTE): 0.1 10*3/uL (ref 0.0–0.4)
Eos: 1 %
Hematocrit: 39.5 % (ref 34.0–46.6)
Hemoglobin: 12.9 g/dL (ref 11.1–15.9)
IMMATURE GRANS (ABS): 0 10*3/uL (ref 0.0–0.1)
IMMATURE GRANULOCYTES: 0 %
LYMPHS: 35 %
Lymphocytes Absolute: 2.1 10*3/uL (ref 0.7–3.1)
MCH: 28.2 pg (ref 26.6–33.0)
MCHC: 32.7 g/dL (ref 31.5–35.7)
MCV: 86 fL (ref 79–97)
MONOCYTES: 7 %
Monocytes Absolute: 0.4 10*3/uL (ref 0.1–0.9)
NEUTROS ABS: 3.2 10*3/uL (ref 1.4–7.0)
NEUTROS PCT: 57 %
PLATELETS: 222 10*3/uL (ref 150–450)
RBC: 4.57 x10E6/uL (ref 3.77–5.28)
RDW: 12 % — ABNORMAL LOW (ref 12.3–15.4)
WBC: 5.8 10*3/uL (ref 3.4–10.8)

## 2018-06-30 LAB — LIPID PANEL
CHOL/HDL RATIO: 5.3 ratio — AB (ref 0.0–4.4)
CHOLESTEROL TOTAL: 219 mg/dL — AB (ref 100–199)
HDL: 41 mg/dL (ref >39)
LDL CALC: 126 mg/dL — AB (ref 0–99)
Triglycerides: 258 mg/dL — ABNORMAL HIGH (ref 0–149)
VLDL Cholesterol Cal: 52 mg/dL — ABNORMAL HIGH (ref 5–40)

## 2018-06-30 LAB — BMP8+EGFR
BUN / CREAT RATIO: 22 (ref 9–23)
BUN: 19 mg/dL (ref 6–24)
CO2: 27 mmol/L (ref 20–29)
CREATININE: 0.88 mg/dL (ref 0.57–1.00)
Calcium: 9.5 mg/dL (ref 8.7–10.2)
Chloride: 104 mmol/L (ref 96–106)
GFR calc non Af Amer: 76 mL/min/{1.73_m2} (ref >59)
GFR, EST AFRICAN AMERICAN: 87 mL/min/{1.73_m2} (ref >59)
GLUCOSE: 104 mg/dL — AB (ref 65–99)
Potassium: 4.2 mmol/L (ref 3.5–5.2)
SODIUM: 143 mmol/L (ref 134–144)

## 2018-06-30 LAB — TSH: TSH: 4.09 u[IU]/mL (ref 0.450–4.500)

## 2018-08-18 DIAGNOSIS — Z1231 Encounter for screening mammogram for malignant neoplasm of breast: Secondary | ICD-10-CM | POA: Diagnosis not present

## 2018-08-18 LAB — HM MAMMOGRAPHY

## 2019-02-25 ENCOUNTER — Telehealth: Payer: Self-pay | Admitting: Family Medicine

## 2019-10-07 ENCOUNTER — Encounter: Payer: 59 | Admitting: Family Medicine

## 2019-10-22 ENCOUNTER — Other Ambulatory Visit: Payer: Self-pay

## 2019-10-22 ENCOUNTER — Encounter: Payer: Self-pay | Admitting: Family Medicine

## 2019-10-22 ENCOUNTER — Ambulatory Visit (INDEPENDENT_AMBULATORY_CARE_PROVIDER_SITE_OTHER): Payer: 59 | Admitting: Family Medicine

## 2019-10-22 VITALS — BP 131/75 | HR 78 | Temp 99.3°F | Ht 60.0 in | Wt 111.0 lb

## 2019-10-22 DIAGNOSIS — Z0000 Encounter for general adult medical examination without abnormal findings: Secondary | ICD-10-CM

## 2019-10-22 DIAGNOSIS — E782 Mixed hyperlipidemia: Secondary | ICD-10-CM | POA: Diagnosis not present

## 2019-10-22 NOTE — Progress Notes (Signed)
BP 131/75   Pulse 78   Temp 99.3 F (37.4 C) (Temporal)   Ht 5' (1.524 m)   Wt 111 lb (50.3 kg)   LMP 03/14/2014   BMI 21.68 kg/m    Subjective:   Patient ID: Carrie Mccoy, female    DOB: Nov 10, 1965, 54 y.o.   MRN: [id number]  HPI: Carrie Mccoy is a 54 y.o. female presenting on 10/22/2019 for Annual Exam   HPI .Well exam and physical Patient is coming in today for well adult exam and physical.  She denies any major health issues or concerns and feels like she is doing pretty well. Patient denies any chest pain, shortness of breath, headaches or vision issues, abdominal complaints, diarrhea, nausea, vomiting, or joint issues.   Relevant past medical, surgical, family and social history reviewed and updated as indicated. Interim medical history since our last visit reviewed. Allergies and medications reviewed and updated.  Review of Systems  Constitutional: Negative for chills and fever.  HENT: Negative for congestion, ear discharge, ear pain and tinnitus.   Eyes: Negative for pain, redness and visual disturbance.  Respiratory: Negative for cough, chest tightness, shortness of breath and wheezing.   Cardiovascular: Negative for chest pain, palpitations and leg swelling.  Gastrointestinal: Negative for abdominal pain, blood in stool, constipation and diarrhea.  Genitourinary: Negative for difficulty urinating, dysuria and hematuria.  Musculoskeletal: Negative for back pain, gait problem and myalgias.  Skin: Negative for rash.  Neurological: Negative for dizziness, weakness, light-headedness and headaches.  Psychiatric/Behavioral: Negative for agitation, behavioral problems and suicidal ideas.  All other systems reviewed and are negative.   Per HPI unless specifically indicated above   Allergies as of 10/22/2019   No Known Allergies     Medication List       Accurate as of October 22, 2019  4:36 PM. If you have any questions, ask your nurse or doctor.        FISH  OIL CONCENTRATE PO Take by mouth.   multivitamin tablet Take 1 tablet by mouth daily.   NON FORMULARY Vitamin B    One daily   NON FORMULARY Vitamin D    One daily   VITAMIN B-12 PO Take 1 tablet by mouth daily.   VITAMIN E PO Take 1 tablet by mouth daily.        Objective:   BP 131/75   Pulse 78   Temp 99.3 F (37.4 C) (Temporal)   Ht 5' (1.524 m)   Wt 111 lb (50.3 kg)   LMP 03/14/2014   BMI 21.68 kg/m   Wt Readings from Last 3 Encounters:  10/22/19 111 lb (50.3 kg)  06/29/18 110 lb 9.6 oz (50.2 kg)  06/27/17 111 lb (50.3 kg)    Physical Exam Vitals and nursing note reviewed.  Constitutional:      General: She is not in acute distress.    Appearance: She is well-developed. She is not diaphoretic.  Eyes:     Conjunctiva/sclera: Conjunctivae normal.  Cardiovascular:     Rate and Rhythm: Normal rate and regular rhythm.     Heart sounds: Normal heart sounds. No murmur.  Pulmonary:     Effort: Pulmonary effort is normal. No respiratory distress.     Breath sounds: Normal breath sounds. No wheezing.  Musculoskeletal:        General: No tenderness. Normal range of motion.  Skin:    General: Skin is warm and dry.     Findings: No rash.  Neurological:     Mental Status: She is alert and oriented to person, place, and time.     Coordination: Coordination normal.  Psychiatric:        Behavior: Behavior normal.       Assessment & Plan:   Problem List Items Addressed This Visit      Other   Hyperlipidemia   Relevant Orders   Lipid panel    Other Visit Diagnoses    Well adult exam    -  Primary   Relevant Orders   CBC with Differential/Platelet   CMP14+EGFR   Lipid panel   TSH      Continue fish oil will check blood work today and continue see yearly unless she is have any major issues, she due for Pap smear next year Follow up plan: Return in about 1 year (around 10/21/2020), or if symptoms worsen or fail to improve, for Adult well  exam.  Counseling provided for all of the vaccine components Orders Placed This Encounter  Procedures  . CBC with Differential/Platelet  . CMP14+EGFR  . Lipid panel  . TSH    Caryl Pina, MD [facility] 10/22/2019, 4:36 PM

## 2019-10-23 LAB — LIPID PANEL
Chol/HDL Ratio: 5.1 ratio — ABNORMAL HIGH (ref 0.0–4.4)
Cholesterol, Total: 218 mg/dL — ABNORMAL HIGH (ref 100–199)
HDL: 43 mg/dL (ref >39)
LDL Chol Calc (NIH): 130 mg/dL — ABNORMAL HIGH (ref 0–99)
Triglycerides: 250 mg/dL — ABNORMAL HIGH (ref 0–149)
VLDL Cholesterol Cal: 45 mg/dL — ABNORMAL HIGH (ref 5–40)

## 2019-10-23 LAB — CBC WITH DIFFERENTIAL/PLATELET
Basophils Absolute: 0 10*3/uL (ref 0.0–0.2)
Basos: 1 %
EOS (ABSOLUTE): 0.1 10*3/uL (ref 0.0–0.4)
Eos: 2 %
Hematocrit: 37.1 % (ref 34.0–46.6)
Hemoglobin: 12.5 g/dL (ref 11.1–15.9)
Immature Grans (Abs): 0 10*3/uL (ref 0.0–0.1)
Immature Granulocytes: 0 %
Lymphocytes Absolute: 2.4 10*3/uL (ref 0.7–3.1)
Lymphs: 38 %
MCH: 29.6 pg (ref 26.6–33.0)
MCHC: 33.7 g/dL (ref 31.5–35.7)
MCV: 88 fL (ref 79–97)
Monocytes Absolute: 0.5 10*3/uL (ref 0.1–0.9)
Monocytes: 7 %
Neutrophils Absolute: 3.2 10*3/uL (ref 1.4–7.0)
Neutrophils: 52 %
Platelets: 211 10*3/uL (ref 150–450)
RBC: 4.23 x10E6/uL (ref 3.77–5.28)
RDW: 12.1 % (ref 11.7–15.4)
WBC: 6.2 10*3/uL (ref 3.4–10.8)

## 2019-10-23 LAB — CMP14+EGFR
ALT: 10 IU/L (ref 0–32)
AST: 13 IU/L (ref 0–40)
Albumin/Globulin Ratio: 1.9 (ref 1.2–2.2)
Albumin: 4.2 g/dL (ref 3.8–4.9)
Alkaline Phosphatase: 76 IU/L (ref 39–117)
BUN/Creatinine Ratio: 25 — ABNORMAL HIGH (ref 9–23)
BUN: 20 mg/dL (ref 6–24)
Bilirubin Total: 0.2 mg/dL (ref 0.0–1.2)
CO2: 29 mmol/L (ref 20–29)
Calcium: 9.1 mg/dL (ref 8.7–10.2)
Chloride: 106 mmol/L (ref 96–106)
Creatinine, Ser: 0.81 mg/dL (ref 0.57–1.00)
GFR calc Af Amer: 95 mL/min/{1.73_m2} (ref >59)
GFR calc non Af Amer: 83 mL/min/{1.73_m2} (ref >59)
Globulin, Total: 2.2 g/dL (ref 1.5–4.5)
Glucose: 100 mg/dL — ABNORMAL HIGH (ref 65–99)
Potassium: 3.9 mmol/L (ref 3.5–5.2)
Sodium: 146 mmol/L — ABNORMAL HIGH (ref 134–144)
Total Protein: 6.4 g/dL (ref 6.0–8.5)

## 2019-10-23 LAB — TSH: TSH: 0.983 u[IU]/mL (ref 0.450–4.500)

## 2020-05-16 ENCOUNTER — Other Ambulatory Visit: Payer: Self-pay | Admitting: *Deleted

## 2020-10-24 ENCOUNTER — Other Ambulatory Visit (HOSPITAL_COMMUNITY)
Admission: RE | Admit: 2020-10-24 | Discharge: 2020-10-24 | Disposition: A | Discharge status: Home / Self Care | Payer: 59 | Source: Ambulatory Visit | Attending: Family Medicine | Admitting: Family Medicine

## 2020-10-24 ENCOUNTER — Other Ambulatory Visit: Payer: Self-pay

## 2020-10-24 ENCOUNTER — Ambulatory Visit (INDEPENDENT_AMBULATORY_CARE_PROVIDER_SITE_OTHER): Payer: Self-pay | Admitting: Family Medicine

## 2020-10-24 ENCOUNTER — Encounter: Payer: Self-pay | Admitting: Family Medicine

## 2020-10-24 VITALS — BP 126/89 | HR 89 | Ht 60.0 in | Wt 114.0 lb

## 2020-10-24 DIAGNOSIS — Z01419 Encounter for gynecological examination (general) (routine) without abnormal findings: Secondary | ICD-10-CM | POA: Insufficient documentation

## 2020-10-24 DIAGNOSIS — Z01411 Encounter for gynecological examination (general) (routine) with abnormal findings: Secondary | ICD-10-CM

## 2020-10-24 DIAGNOSIS — E782 Mixed hyperlipidemia: Secondary | ICD-10-CM

## 2020-10-24 NOTE — Progress Notes (Signed)
BP 126/89   Pulse 89   Ht 5' (1.524 m)   Wt 114 lb (51.7 kg)   LMP 03/14/2014   SpO2 98%   BMI 22.26 kg/m    Subjective:   Patient ID: Carrie Mccoy, female    DOB: Apr 03, 1966, 55 y.o.   MRN: [id number]  HPI: Carrie Mccoy is a 55 y.o. female presenting on 10/24/2020 for Medical Management of Chronic Issues (CPE with pap)   HPI Adult well exam and physical Patient comes in for adult well exam and physical and recheck of chronic medical issues. Patient denies any chest pain, shortness of breath, headaches or vision issues, abdominal complaints, diarrhea, nausea, vomiting, or joint issues.   Hyperlipidemia Patient is coming in for recheck of his hyperlipidemia. The patient is currently taking no medication currently except fish oils, will monitor and check for blood work.. They deny any issues with myalgias or history of liver damage from it. They deny any focal numbness or weakness or chest pain.   Relevant past medical, surgical, family and social history reviewed and updated as indicated. Interim medical history since our last visit reviewed. Allergies and medications reviewed and updated.  Review of Systems  Constitutional: Negative for chills and fever.  HENT: Negative for ear pain and tinnitus.   Eyes: Negative for pain.  Respiratory: Negative for cough, shortness of breath and wheezing.   Cardiovascular: Negative for chest pain, palpitations and leg swelling.  Gastrointestinal: Negative for abdominal pain, blood in stool, constipation and diarrhea.  Genitourinary: Negative for dysuria and hematuria.  Musculoskeletal: Negative for back pain and myalgias.  Skin: Negative for rash.  Neurological: Negative for dizziness, weakness and headaches.  Psychiatric/Behavioral: Negative for suicidal ideas.    Per HPI unless specifically indicated above   Allergies as of 10/24/2020   No Known Allergies     Medication List       Accurate as of October 24, 2020  3:45 PM. If  you have any questions, ask your nurse or doctor.        FISH OIL CONCENTRATE PO Take by mouth.   multivitamin tablet Take 1 tablet by mouth daily.   NON FORMULARY Vitamin B    One daily   NON FORMULARY Vitamin D    One daily   VITAMIN B-12 PO Take 1 tablet by mouth daily.   VITAMIN E PO Take 1 tablet by mouth daily.        Objective:   BP 126/89   Pulse 89   Ht 5' (1.524 m)   Wt 114 lb (51.7 kg)   LMP 03/14/2014   SpO2 98%   BMI 22.26 kg/m   Wt Readings from Last 3 Encounters:  10/24/20 114 lb (51.7 kg)  10/22/19 111 lb (50.3 kg)  06/29/18 110 lb 9.6 oz (50.2 kg)    Physical Exam Vitals reviewed.  Constitutional:      General: She is not in acute distress.    Appearance: She is well-developed and well-nourished. She is not diaphoretic.  Eyes:     Extraocular Movements: Extraocular movements intact and EOM normal.     Conjunctiva/sclera: Conjunctivae normal.     Pupils: Pupils are equal, round, and reactive to light.  Neck:     Thyroid: No thyromegaly.  Cardiovascular:     Rate and Rhythm: Normal rate and regular rhythm.     Pulses: Intact distal pulses.     Heart sounds: Normal heart sounds. No murmur heard.   Pulmonary:  Effort: Pulmonary effort is normal. No respiratory distress.     Breath sounds: Normal breath sounds. No wheezing.  Chest:  Breasts: No discharge from either breast. No tenderness and bleeding. Breasts are symmetrical.     Right: No inverted nipple, mass, nipple discharge, skin change or tenderness.     Left: No inverted nipple, mass, nipple discharge, skin change or tenderness.    Abdominal:     General: Bowel sounds are normal. There is no distension.     Palpations: Abdomen is soft.     Tenderness: There is no abdominal tenderness. There is no guarding or rebound.  Genitourinary:    Exam position: Supine.     Labia:        Right: No rash or lesion.        Left: No rash or lesion.      Vagina: Normal.     Cervix: No  cervical motion tenderness, discharge or friability.     Uterus: Normal. Not deviated, not enlarged, not fixed and not tender.      Adnexa:        Right: No mass or tenderness.         Left: No mass or tenderness.    Musculoskeletal:        General: No edema. Normal range of motion.     Cervical back: Neck supple.  Lymphadenopathy:     Cervical: No cervical adenopathy.     Upper Body:  No axillary adenopathy present. Skin:    General: Skin is warm and dry.     Findings: No rash.  Neurological:     Mental Status: She is alert and oriented to person, place, and time.     Coordination: Coordination normal.  Psychiatric:        Mood and Affect: Mood and affect normal.        Behavior: Behavior normal.       Assessment & Plan:   Problem List Items Addressed This Visit      Other   Hyperlipidemia   Relevant Orders   Lipid panel    Other Visit Diagnoses    Well woman exam with routine gynecological exam    -  Primary   Relevant Orders   CBC with Differential/Platelet   CMP14+EGFR   Lipid panel   TSH   Cytology - PAP([place])      She will schedule her mammogram on the way out. Follow up plan: Return in about 1 year (around 10/24/2021), or if symptoms worsen or fail to improve.  Counseling provided for all of the vaccine components Orders Placed This Encounter  Procedures  . CBC with Differential/Platelet  . CMP14+EGFR  . Lipid panel  . TSH    Caryl Pina, MD [facility] 10/24/2020, 3:45 PM

## 2020-10-25 LAB — CMP14+EGFR
ALT: 12 IU/L (ref 0–32)
AST: 21 IU/L (ref 0–40)
Albumin/Globulin Ratio: 2.3 — ABNORMAL HIGH (ref 1.2–2.2)
Albumin: 4.8 g/dL (ref 3.8–4.9)
Alkaline Phosphatase: 87 IU/L (ref 44–121)
BUN/Creatinine Ratio: 21 (ref 9–23)
BUN: 19 mg/dL (ref 6–24)
Bilirubin Total: 0.3 mg/dL (ref 0.0–1.2)
CO2: 27 mmol/L (ref 20–29)
Calcium: 9.4 mg/dL (ref 8.7–10.2)
Chloride: 103 mmol/L (ref 96–106)
Creatinine, Ser: 0.9 mg/dL (ref 0.57–1.00)
GFR calc Af Amer: 83 mL/min/{1.73_m2} (ref >59)
GFR calc non Af Amer: 72 mL/min/{1.73_m2} (ref >59)
Globulin, Total: 2.1 g/dL (ref 1.5–4.5)
Glucose: 95 mg/dL (ref 65–99)
Potassium: 3.9 mmol/L (ref 3.5–5.2)
Sodium: 143 mmol/L (ref 134–144)
Total Protein: 6.9 g/dL (ref 6.0–8.5)

## 2020-10-25 LAB — CBC WITH DIFFERENTIAL/PLATELET
Basophils Absolute: 0 10*3/uL (ref 0.0–0.2)
Basos: 1 %
EOS (ABSOLUTE): 0 10*3/uL (ref 0.0–0.4)
Eos: 1 %
Hematocrit: 37.8 % (ref 34.0–46.6)
Hemoglobin: 13.2 g/dL (ref 11.1–15.9)
Immature Grans (Abs): 0 10*3/uL (ref 0.0–0.1)
Immature Granulocytes: 0 %
Lymphocytes Absolute: 1.1 10*3/uL (ref 0.7–3.1)
Lymphs: 26 %
MCH: 29.8 pg (ref 26.6–33.0)
MCHC: 34.9 g/dL (ref 31.5–35.7)
MCV: 85 fL (ref 79–97)
Monocytes Absolute: 0.7 10*3/uL (ref 0.1–0.9)
Monocytes: 16 %
Neutrophils Absolute: 2.5 10*3/uL (ref 1.4–7.0)
Neutrophils: 56 %
Platelets: 191 10*3/uL (ref 150–450)
RBC: 4.43 x10E6/uL (ref 3.77–5.28)
RDW: 12.3 % (ref 11.7–15.4)
WBC: 4.3 10*3/uL (ref 3.4–10.8)

## 2020-10-25 LAB — LIPID PANEL
Chol/HDL Ratio: 4 ratio (ref 0.0–4.4)
Cholesterol, Total: 216 mg/dL — ABNORMAL HIGH (ref 100–199)
HDL: 54 mg/dL (ref >39)
LDL Chol Calc (NIH): 139 mg/dL — ABNORMAL HIGH (ref 0–99)
Triglycerides: 131 mg/dL (ref 0–149)
VLDL Cholesterol Cal: 23 mg/dL (ref 5–40)

## 2020-10-25 LAB — TSH: TSH: 0.818 u[IU]/mL (ref 0.450–4.500)

## 2020-10-27 ENCOUNTER — Other Ambulatory Visit: Payer: Self-pay

## 2020-10-27 DIAGNOSIS — E782 Mixed hyperlipidemia: Secondary | ICD-10-CM

## 2020-10-27 MED ORDER — ROSUVASTATIN CALCIUM 10 MG PO TABS: 10.0000 mg | ORAL_TABLET | Freq: Every day | ORAL | 1 refills | Status: DC

## 2020-10-30 LAB — CYTOLOGY - PAP: Diagnosis: NEGATIVE

## 2020-12-14 ENCOUNTER — Other Ambulatory Visit: Payer: Self-pay | Admitting: Family Medicine

## 2020-12-14 DIAGNOSIS — Z1231 Encounter for screening mammogram for malignant neoplasm of breast: Secondary | ICD-10-CM

## 2020-12-20 ENCOUNTER — Ambulatory Visit
Admission: RE | Admit: 2020-12-20 | Discharge: 2020-12-20 | Disposition: A | Discharge status: Home / Self Care | Payer: PRIVATE HEALTH INSURANCE | Source: Ambulatory Visit | Attending: Family Medicine | Admitting: Family Medicine

## 2020-12-20 ENCOUNTER — Other Ambulatory Visit: Payer: Self-pay

## 2020-12-20 DIAGNOSIS — Z1231 Encounter for screening mammogram for malignant neoplasm of breast: Secondary | ICD-10-CM

## 2021-04-20 ENCOUNTER — Other Ambulatory Visit: Payer: Self-pay | Admitting: Family Medicine

## 2021-04-20 NOTE — Telephone Encounter (Signed)
OV 10/24/20 RTC 1 yr

## 2021-04-30 ENCOUNTER — Other Ambulatory Visit: Payer: Self-pay

## 2021-04-30 DIAGNOSIS — E782 Mixed hyperlipidemia: Secondary | ICD-10-CM

## 2021-05-01 LAB — LIPID PANEL
Chol/HDL Ratio: 3.8 ratio (ref 0.0–4.4)
Cholesterol, Total: 164 mg/dL (ref 100–199)
HDL: 43 mg/dL (ref >39)
LDL Chol Calc (NIH): 72 mg/dL (ref 0–99)
Triglycerides: 307 mg/dL — ABNORMAL HIGH (ref 0–149)
VLDL Cholesterol Cal: 49 mg/dL — ABNORMAL HIGH (ref 5–40)

## 2021-10-25 ENCOUNTER — Ambulatory Visit (INDEPENDENT_AMBULATORY_CARE_PROVIDER_SITE_OTHER): Payer: PRIVATE HEALTH INSURANCE | Admitting: Family Medicine

## 2021-10-25 ENCOUNTER — Encounter: Payer: Self-pay | Admitting: Family Medicine

## 2021-10-25 VITALS — BP 124/78 | HR 67 | Ht 60.0 in | Wt 111.0 lb

## 2021-10-25 DIAGNOSIS — Z01411 Encounter for gynecological examination (general) (routine) with abnormal findings: Secondary | ICD-10-CM | POA: Diagnosis not present

## 2021-10-25 DIAGNOSIS — Z01419 Encounter for gynecological examination (general) (routine) without abnormal findings: Secondary | ICD-10-CM

## 2021-10-25 DIAGNOSIS — N952 Postmenopausal atrophic vaginitis: Secondary | ICD-10-CM | POA: Diagnosis not present

## 2021-10-25 DIAGNOSIS — E782 Mixed hyperlipidemia: Secondary | ICD-10-CM

## 2021-10-25 MED ORDER — ESTRADIOL 0.1 MG/GM VA CREA: 1.0000 | TOPICAL_CREAM | Freq: Every day | VAGINAL | 12 refills | Status: DC

## 2021-10-25 MED ORDER — ROSUVASTATIN CALCIUM 10 MG PO TABS: 10.0000 mg | ORAL_TABLET | Freq: Every day | ORAL | 3 refills | Status: DC

## 2021-10-25 NOTE — Progress Notes (Signed)
BP 124/78    Pulse 67    Ht 5' (1.524 m)    Wt 111 lb (50.3 kg)    LMP 03/14/2014    SpO2 97%    BMI 21.68 kg/m    Subjective:   Patient ID: Carrie Mccoy, female    DOB: 03-24-1966, 56 y.o.   MRN: [id number]  HPI: Carrie Mccoy is a 56 y.o. female presenting on 10/25/2021 for Medical Management of Chronic Issues (CPE with breast exam and manuel check. Pap last year normal)   HPI Well woman exam Patient denies any chest pain, shortness of breath, headaches or vision issues, abdominal complaints, diarrhea, nausea, vomiting, or joint issues.  Patient denies any breast lumps or bumps or vaginal irritation discharge or bleeding.  She is postmenopausal.  Hyperlipidemia Patient is coming in for recheck of his hyperlipidemia. The patient is currently taking Crestor. They deny any issues with myalgias or history of liver damage from it. They deny any focal numbness or weakness or chest pain.   Relevant past medical, surgical, family and social history reviewed and updated as indicated. Interim medical history since our last visit reviewed. Allergies and medications reviewed and updated.  Review of Systems  Constitutional:  Negative for chills and fever.  HENT:  Negative for ear pain and tinnitus.   Eyes:  Negative for pain.  Respiratory:  Negative for cough, shortness of breath and wheezing.   Cardiovascular:  Negative for chest pain, palpitations and leg swelling.  Gastrointestinal:  Negative for abdominal pain, blood in stool, constipation and diarrhea.  Genitourinary:  Positive for vaginal pain. Negative for dysuria, hematuria, pelvic pain, vaginal bleeding and vaginal discharge.  Musculoskeletal:  Negative for back pain and myalgias.  Skin:  Negative for rash.  Neurological:  Negative for dizziness, weakness and headaches.  Psychiatric/Behavioral:  Negative for suicidal ideas.    Per HPI unless specifically indicated above   Allergies as of 10/25/2021   No Known Allergies       Medication List        Accurate as of October 25, 2021  3:23 PM. If you have any questions, ask your nurse or doctor.          estradiol 0.1 MG/GM vaginal cream Commonly known as: ESTRACE VAGINAL Place 1 Applicatorful vaginally at bedtime. Started by: Fransisca Kaufmann Jlee Harkless, MD   FISH OIL CONCENTRATE PO Take by mouth.   multivitamin tablet Take 1 tablet by mouth daily.   NON FORMULARY Vitamin B    One daily   NON FORMULARY Vitamin D    One daily   rosuvastatin 10 MG tablet Commonly known as: CRESTOR Take 1 tablet (10 mg total) by mouth daily.   VITAMIN B-12 PO Take 1 tablet by mouth daily.   VITAMIN E PO Take 1 tablet by mouth daily.         Objective:   BP 124/78    Pulse 67    Ht 5' (1.524 m)    Wt 111 lb (50.3 kg)    LMP 03/14/2014    SpO2 97%    BMI 21.68 kg/m   Wt Readings from Last 3 Encounters:  10/25/21 111 lb (50.3 kg)  10/24/20 114 lb (51.7 kg)  10/22/19 111 lb (50.3 kg)    Physical Exam Vitals reviewed. Exam conducted with a chaperone present.  Constitutional:      General: She is not in acute distress.    Appearance: She is well-developed. She is not diaphoretic.  Eyes:  Conjunctiva/sclera: Conjunctivae normal.     Pupils: Pupils are equal, round, and reactive to light.  Neck:     Thyroid: No thyromegaly.  Cardiovascular:     Rate and Rhythm: Normal rate and regular rhythm.     Heart sounds: Normal heart sounds. No murmur heard. Pulmonary:     Effort: Pulmonary effort is normal. No respiratory distress.     Breath sounds: Normal breath sounds. No wheezing.  Chest:  Breasts:    Breasts are symmetrical.     Right: No inverted nipple, mass, nipple discharge, skin change or tenderness.     Left: No inverted nipple, mass, nipple discharge, skin change or tenderness.  Abdominal:     General: Bowel sounds are normal. There is no distension.     Palpations: Abdomen is soft.     Tenderness: There is no abdominal tenderness. There is no  guarding or rebound.  Genitourinary:    Exam position: Lithotomy position.     Labia:        Right: No rash or lesion.        Left: No rash or lesion.      Vagina: Tenderness (Vaginal dryness) present. No vaginal discharge, bleeding or lesions.     Cervix: No cervical motion tenderness, discharge, friability or cervical bleeding.     Uterus: Not deviated, not enlarged, not fixed, not tender and no uterine prolapse.      Adnexa:        Right: No mass or tenderness.         Left: No mass or tenderness.    Musculoskeletal:        General: Normal range of motion.     Cervical back: Neck supple.  Lymphadenopathy:     Cervical: No cervical adenopathy.  Skin:    General: Skin is warm and dry.     Findings: No rash.  Neurological:     Mental Status: She is alert and oriented to person, place, and time.     Coordination: Coordination normal.  Psychiatric:        Behavior: Behavior normal.      Assessment & Plan:   Problem List Items Addressed This Visit       Genitourinary   Atrophic vaginitis   Relevant Medications   estradiol (ESTRACE VAGINAL) 0.1 MG/GM vaginal cream   Other Relevant Orders   CBC with Differential/Platelet     Other   Hyperlipidemia   Relevant Medications   rosuvastatin (CRESTOR) 10 MG tablet   Other Relevant Orders   Lipid panel   Other Visit Diagnoses     Well woman exam with routine gynecological exam    -  Primary   Relevant Orders   CBC with Differential/Platelet   CMP14+EGFR       Patient will start Estrace cream to help with vaginal atrophy.  We will check blood work on the way out. Follow up plan: Return in about 1 year (around 10/25/2022), or if symptoms worsen or fail to improve, for Physical exam and well woman.  Counseling provided for all of the vaccine components Orders Placed This Encounter  Procedures   CBC with Differential/Platelet   CMP14+EGFR   Lipid panel    Caryl Pina, MD [facility] 10/25/2021, 3:23 PM

## 2021-10-26 ENCOUNTER — Other Ambulatory Visit: Payer: Self-pay | Admitting: Family Medicine

## 2021-10-26 DIAGNOSIS — Z1231 Encounter for screening mammogram for malignant neoplasm of breast: Secondary | ICD-10-CM

## 2021-10-26 LAB — CBC WITH DIFFERENTIAL/PLATELET
Basophils Absolute: 0 10*3/uL (ref 0.0–0.2)
Basos: 1 %
EOS (ABSOLUTE): 0.1 10*3/uL (ref 0.0–0.4)
Eos: 1 %
Hematocrit: 37.1 % (ref 34.0–46.6)
Hemoglobin: 12.7 g/dL (ref 11.1–15.9)
Immature Grans (Abs): 0 10*3/uL (ref 0.0–0.1)
Immature Granulocytes: 0 %
Lymphocytes Absolute: 2.3 10*3/uL (ref 0.7–3.1)
Lymphs: 32 %
MCH: 29.6 pg (ref 26.6–33.0)
MCHC: 34.2 g/dL (ref 31.5–35.7)
MCV: 87 fL (ref 79–97)
Monocytes Absolute: 0.6 10*3/uL (ref 0.1–0.9)
Monocytes: 8 %
Neutrophils Absolute: 4.1 10*3/uL (ref 1.4–7.0)
Neutrophils: 58 %
Platelets: 193 10*3/uL (ref 150–450)
RBC: 4.29 x10E6/uL (ref 3.77–5.28)
RDW: 12.2 % (ref 11.7–15.4)
WBC: 7.1 10*3/uL (ref 3.4–10.8)

## 2021-10-26 LAB — CMP14+EGFR
ALT: 20 IU/L (ref 0–32)
AST: 18 IU/L (ref 0–40)
Albumin/Globulin Ratio: 1.8 (ref 1.2–2.2)
Albumin: 4.4 g/dL (ref 3.8–4.9)
Alkaline Phosphatase: 81 IU/L (ref 44–121)
BUN/Creatinine Ratio: 20 (ref 9–23)
BUN: 19 mg/dL (ref 6–24)
Bilirubin Total: 0.3 mg/dL (ref 0.0–1.2)
CO2: 28 mmol/L (ref 20–29)
Calcium: 9.3 mg/dL (ref 8.7–10.2)
Chloride: 103 mmol/L (ref 96–106)
Creatinine, Ser: 0.97 mg/dL (ref 0.57–1.00)
Globulin, Total: 2.4 g/dL (ref 1.5–4.5)
Glucose: 98 mg/dL (ref 70–99)
Potassium: 3.8 mmol/L (ref 3.5–5.2)
Sodium: 143 mmol/L (ref 134–144)
Total Protein: 6.8 g/dL (ref 6.0–8.5)
eGFR: 69 mL/min/{1.73_m2} (ref >59)

## 2021-10-26 LAB — LIPID PANEL
Chol/HDL Ratio: 2.9 ratio (ref 0.0–4.4)
Cholesterol, Total: 145 mg/dL (ref 100–199)
HDL: 50 mg/dL (ref >39)
LDL Chol Calc (NIH): 77 mg/dL (ref 0–99)
Triglycerides: 98 mg/dL (ref 0–149)
VLDL Cholesterol Cal: 18 mg/dL (ref 5–40)

## 2021-12-26 ENCOUNTER — Ambulatory Visit
Admission: RE | Admit: 2021-12-26 | Discharge: 2021-12-26 | Disposition: A | Discharge status: Home / Self Care | Payer: PRIVATE HEALTH INSURANCE | Source: Ambulatory Visit | Attending: Family Medicine | Admitting: Family Medicine

## 2021-12-26 DIAGNOSIS — Z1231 Encounter for screening mammogram for malignant neoplasm of breast: Secondary | ICD-10-CM

## 2021-12-28 ENCOUNTER — Other Ambulatory Visit: Payer: Self-pay | Admitting: Family Medicine

## 2021-12-28 DIAGNOSIS — R928 Other abnormal and inconclusive findings on diagnostic imaging of breast: Secondary | ICD-10-CM

## 2022-01-14 ENCOUNTER — Ambulatory Visit
Admission: RE | Admit: 2022-01-14 | Discharge: 2022-01-14 | Disposition: A | Discharge status: Home / Self Care | Payer: PRIVATE HEALTH INSURANCE | Source: Ambulatory Visit | Attending: Family Medicine | Admitting: Family Medicine

## 2022-01-14 DIAGNOSIS — R928 Other abnormal and inconclusive findings on diagnostic imaging of breast: Secondary | ICD-10-CM

## 2022-05-02 IMAGING — MG MM DIGITAL SCREENING BILAT W/ TOMO AND CAD
6 of 10 series · 6 of 30 positions shown · non-contrast
Comparison: Previous exam(s).

CLINICAL DATA: Screening.

EXAM:
DIGITAL SCREENING BILATERAL MAMMOGRAM WITH TOMOSYNTHESIS AND CAD
TECHNIQUE: Bilateral screening digital craniocaudal and mediolateral oblique
mammograms were obtained. Bilateral screening digital breast
tomosynthesis was performed. The images were evaluated with
computer-aided detection.

[L MLO synth-2D (1 of 2)]
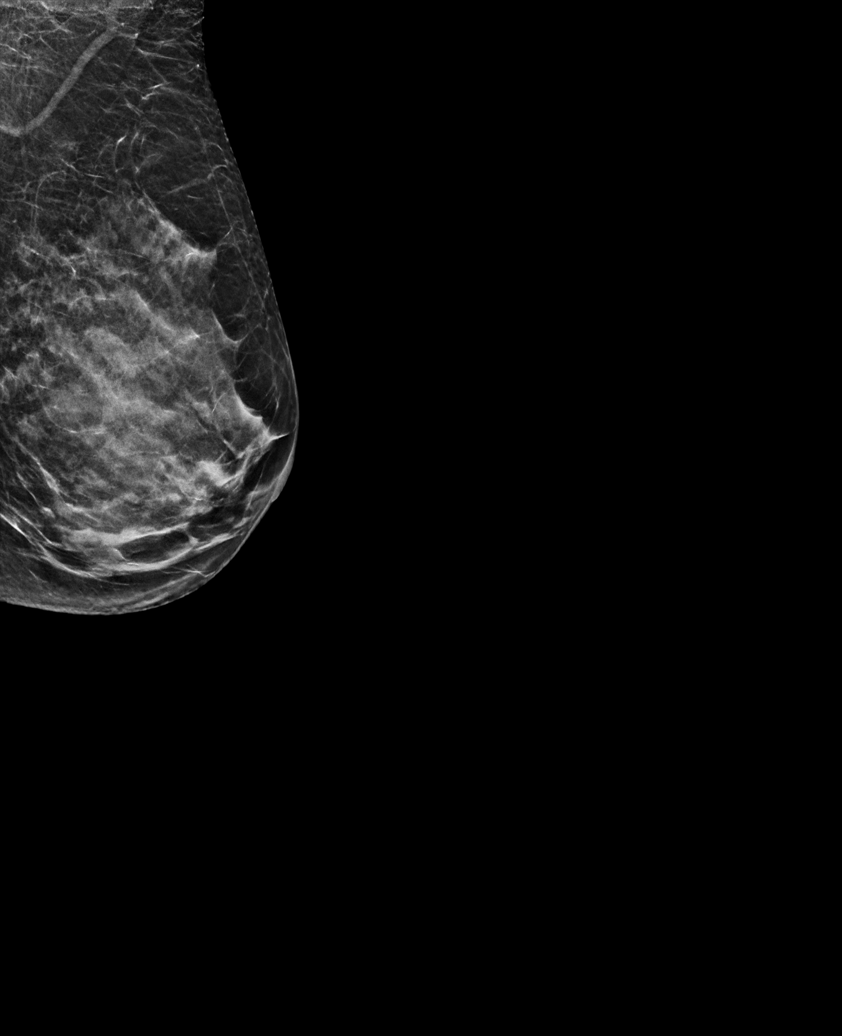

[L CC synth-2D]
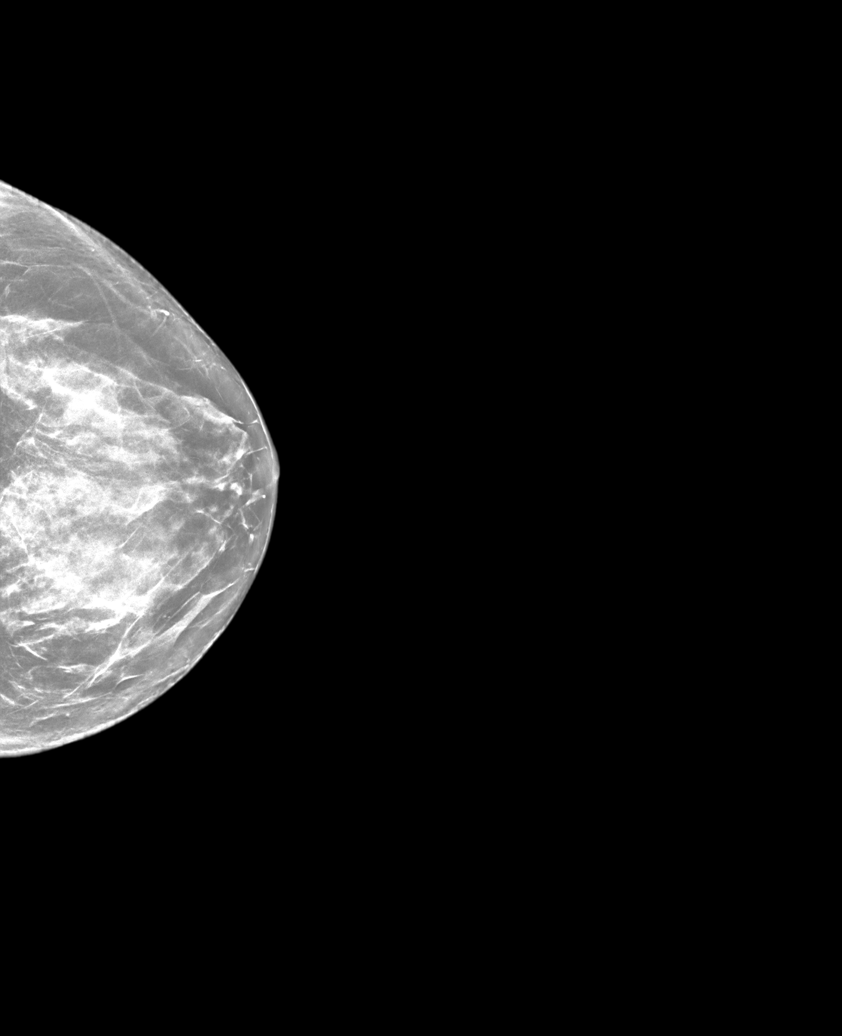

[L MLO synth-2D (2 of 2)]
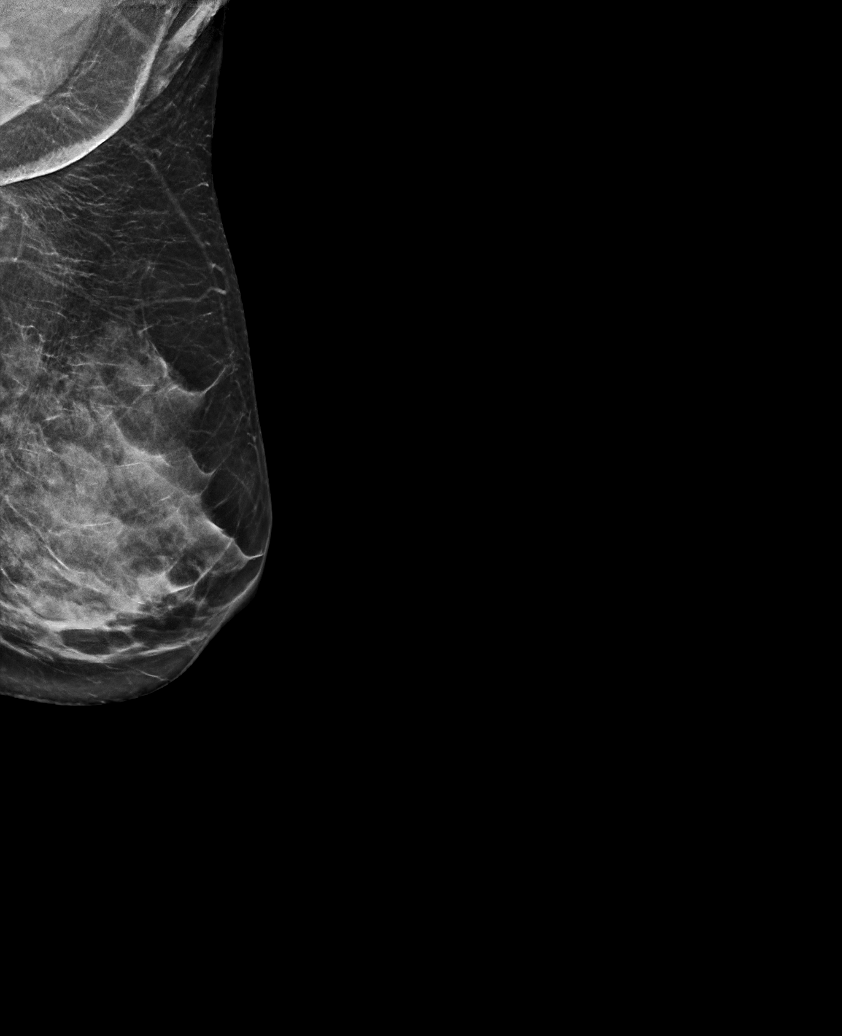

[R MLO synth-2D]
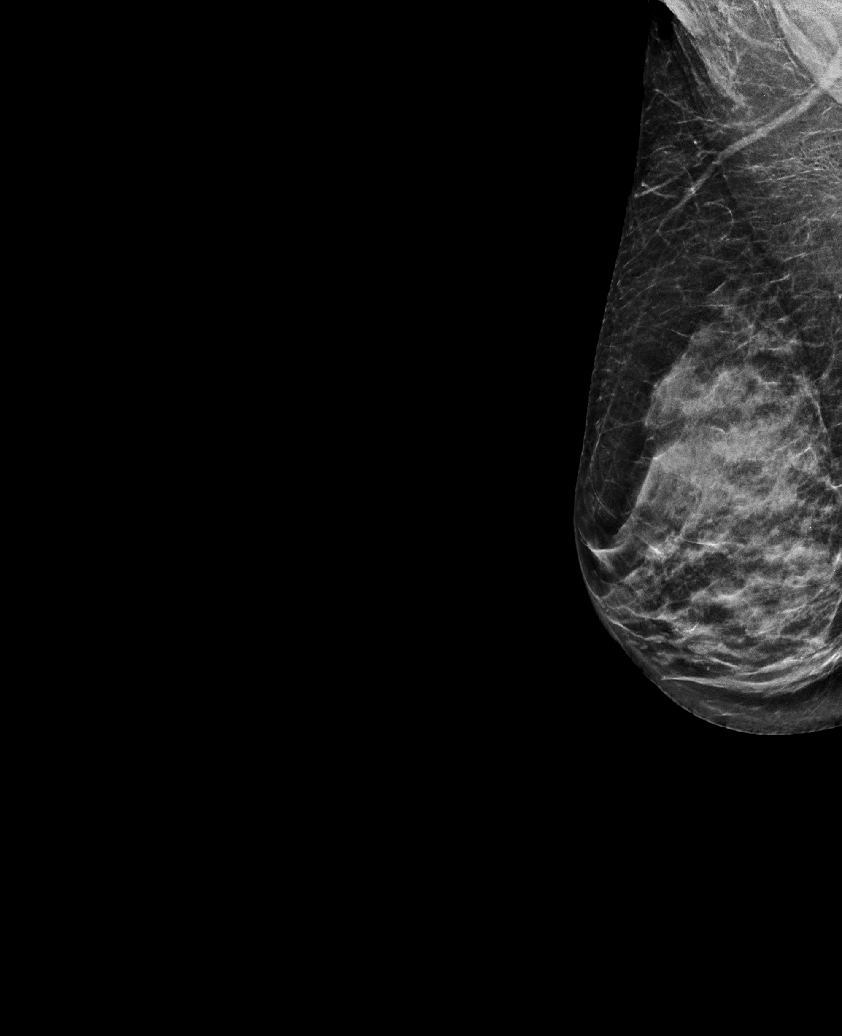

[R CC synth-2D]
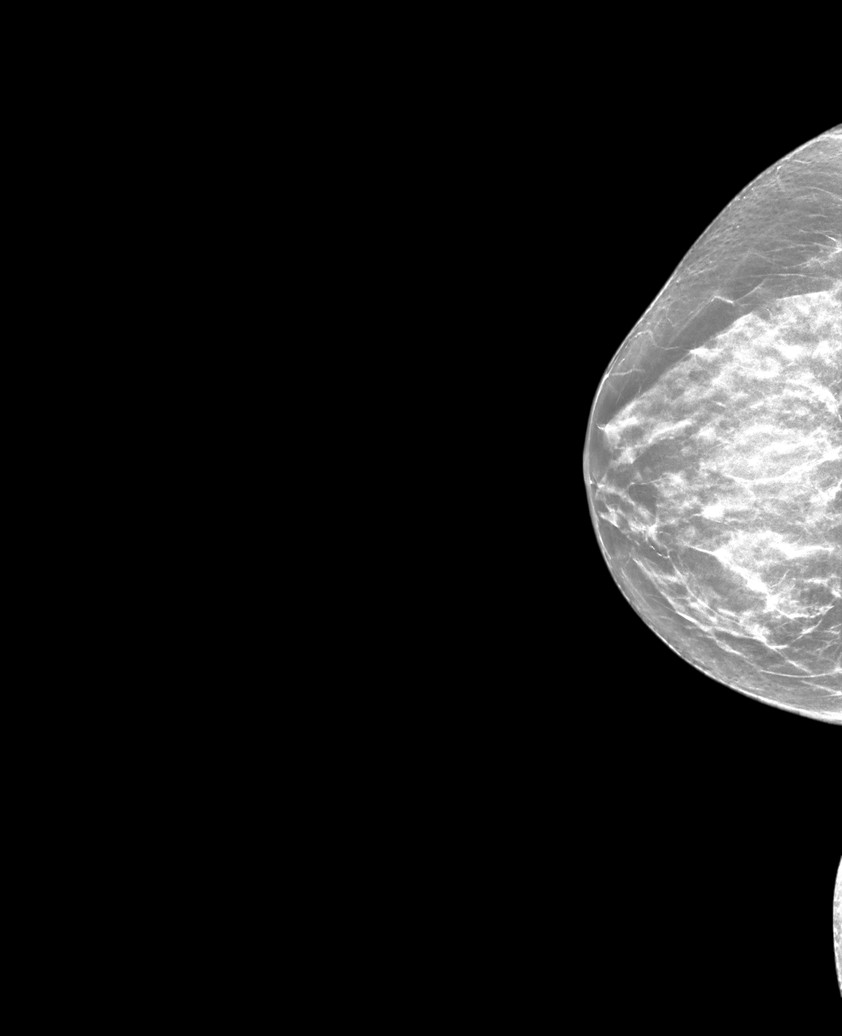

[R CC tomo · tomo slice 27/52.0]
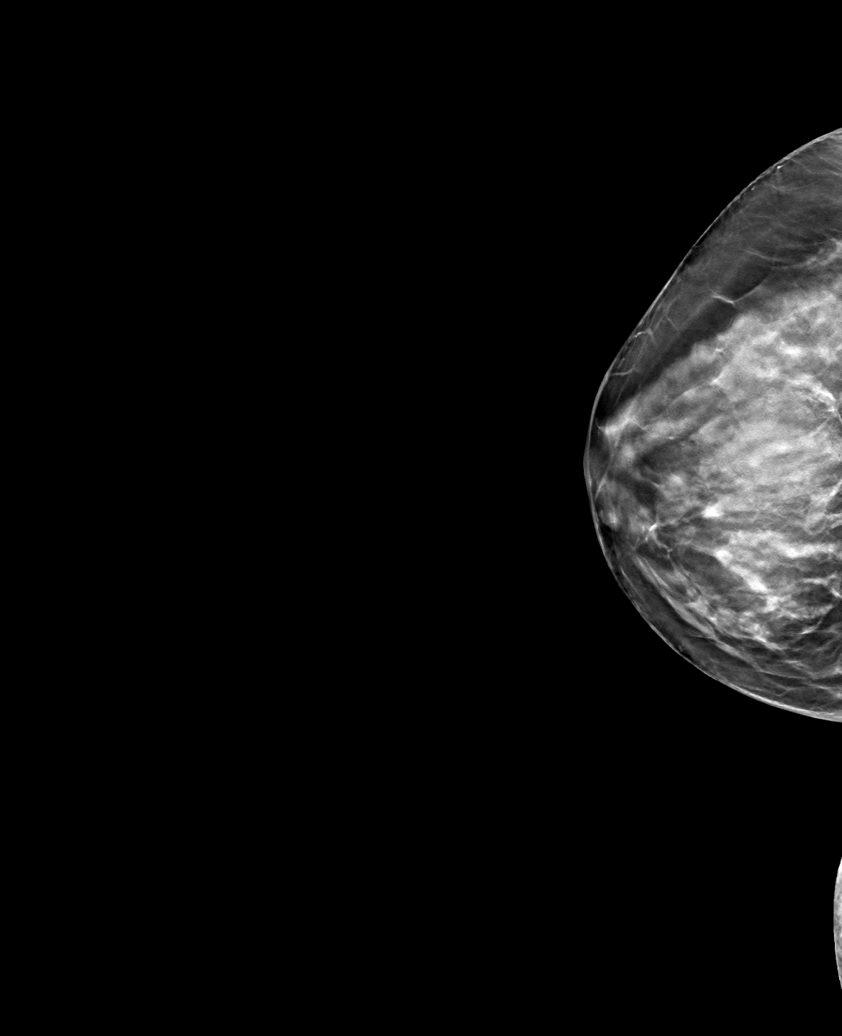

[6 of 30 positions shown; findings below may reference images not displayed]

ACR Breast Density Category d: The breast tissue is extremely dense,
which lowers the sensitivity of mammography
FINDINGS: There are no findings suspicious for malignancy. The images were
evaluated with computer-aided detection.
IMPRESSION: No mammographic evidence of malignancy. A result letter of this
screening mammogram will be mailed directly to the patient.

RECOMMENDATION:
Screening mammogram in one year. (Code:95-0-E9V)

BI-RADS CATEGORY  1: Negative.

## 2022-10-01 ENCOUNTER — Other Ambulatory Visit: Payer: Self-pay | Admitting: Family Medicine

## 2022-10-25 ENCOUNTER — Other Ambulatory Visit: Payer: Self-pay | Admitting: Family Medicine

## 2022-10-25 DIAGNOSIS — N952 Postmenopausal atrophic vaginitis: Secondary | ICD-10-CM

## 2022-10-28 ENCOUNTER — Ambulatory Visit (INDEPENDENT_AMBULATORY_CARE_PROVIDER_SITE_OTHER): Payer: No Typology Code available for payment source | Admitting: Family Medicine

## 2022-10-28 ENCOUNTER — Encounter: Payer: Self-pay | Admitting: Family Medicine

## 2022-10-28 VITALS — BP 111/76 | HR 100 | Temp 98.3°F | Ht 60.0 in | Wt 112.0 lb

## 2022-10-28 DIAGNOSIS — N952 Postmenopausal atrophic vaginitis: Secondary | ICD-10-CM

## 2022-10-28 DIAGNOSIS — Z0001 Encounter for general adult medical examination with abnormal findings: Secondary | ICD-10-CM | POA: Diagnosis not present

## 2022-10-28 DIAGNOSIS — E782 Mixed hyperlipidemia: Secondary | ICD-10-CM | POA: Diagnosis not present

## 2022-10-28 DIAGNOSIS — Z0000 Encounter for general adult medical examination without abnormal findings: Secondary | ICD-10-CM

## 2022-10-28 MED ORDER — ESTRADIOL 0.1 MG/GM VA CREA: 1.0000 | TOPICAL_CREAM | VAGINAL | 3 refills | Status: DC

## 2022-10-28 MED ORDER — ROSUVASTATIN CALCIUM 10 MG PO TABS
10.0000 mg | ORAL_TABLET | Freq: Every day | ORAL | 3 refills | Status: DC
Start: 2022-10-28 — End: 2023-10-30

## 2022-10-28 NOTE — Progress Notes (Signed)
BP 111/76   Pulse 100   Temp 98.3 F (36.8 C)   Ht 5' (1.524 m)   Wt 112 lb (50.8 kg)   LMP 03/14/2014   SpO2 98%   BMI 21.87 kg/m    Subjective:   Patient ID: Carrie Mccoy, female    DOB: 28-Aug-1966, 57 y.o.   MRN: [id number]  HPI: Carrie Mccoy is a 57 y.o. female presenting on 10/28/2022 for Medical Management of Chronic Issues (CPE with pelvic and breast)   HPI Well woman and physical exam Patient denies any chest pain, shortness of breath, headaches or vision issues, abdominal complaints, diarrhea, nausea, vomiting, or joint issues.  Patient denies any breast or vaginal issues.  Patient is postmenopausal.  Patient has atrophic vaginitis and still using estrogen cream, doing well.  Hyperlipidemia Patient is coming in for recheck of his hyperlipidemia. The patient is currently taking Crestor. They deny any issues with myalgias or history of liver damage from it. They deny any focal numbness or weakness or chest pain.   Relevant past medical, surgical, family and social history reviewed and updated as indicated. Interim medical history since our last visit reviewed. Allergies and medications reviewed and updated.  Review of Systems  Constitutional:  Negative for chills and fever.  HENT:  Negative for congestion, ear discharge, ear pain and tinnitus.   Eyes:  Negative for pain, redness and visual disturbance.  Respiratory:  Negative for cough, chest tightness, shortness of breath and wheezing.   Cardiovascular:  Negative for chest pain, palpitations and leg swelling.  Gastrointestinal:  Negative for abdominal pain, blood in stool, constipation and diarrhea.  Genitourinary:  Negative for difficulty urinating, dysuria and hematuria.  Musculoskeletal:  Negative for back pain, gait problem and myalgias.  Skin:  Negative for rash.  Neurological:  Negative for dizziness, weakness, light-headedness and headaches.  Psychiatric/Behavioral:  Negative for agitation, behavioral  problems and suicidal ideas.   All other systems reviewed and are negative.   Per HPI unless specifically indicated above   Allergies as of 10/28/2022   No Known Allergies      Medication List        Accurate as of October 28, 2022  3:05 PM. If you have any questions, ask your nurse or doctor.          STOP taking these medications    NON FORMULARY Stopped by: Fransisca Kaufmann Ludwig Tugwell, MD       TAKE these medications    estradiol 0.1 MG/GM vaginal cream Commonly known as: ESTRACE Place 1 Applicatorful vaginally 3 (three) times a week. What changed: when to take this Changed by: Fransisca Kaufmann Ayris Carano, MD   FISH OIL CONCENTRATE PO Take by mouth.   multivitamin tablet Take 1 tablet by mouth daily.   NON FORMULARY Vitamin D    One daily   rosuvastatin 10 MG tablet Commonly known as: CRESTOR Take 1 tablet (10 mg total) by mouth daily.   VITAMIN B-12 PO Take 1 tablet by mouth daily.   VITAMIN E PO Take 1 tablet by mouth daily.         Objective:   BP 111/76   Pulse 100   Temp 98.3 F (36.8 C)   Ht 5' (1.524 m)   Wt 112 lb (50.8 kg)   LMP 03/14/2014   SpO2 98%   BMI 21.87 kg/m   Wt Readings from Last 3 Encounters:  10/28/22 112 lb (50.8 kg)  10/25/21 111 lb (50.3 kg)  10/24/20  114 lb (51.7 kg)    Physical Exam Vitals reviewed.  Constitutional:      General: She is not in acute distress.    Appearance: She is well-developed. She is not diaphoretic.  Eyes:     Conjunctiva/sclera: Conjunctivae normal.  Neck:     Thyroid: No thyromegaly.  Cardiovascular:     Rate and Rhythm: Normal rate and regular rhythm.     Heart sounds: Normal heart sounds. No murmur heard. Pulmonary:     Effort: Pulmonary effort is normal. No respiratory distress.     Breath sounds: Normal breath sounds. No wheezing.  Chest:  Breasts:    Breasts are symmetrical.     Right: No inverted nipple, mass, nipple discharge, skin change or tenderness.     Left: No inverted  nipple, mass, nipple discharge, skin change or tenderness.  Abdominal:     General: Bowel sounds are normal. There is no distension.     Palpations: Abdomen is soft.     Tenderness: There is no abdominal tenderness. There is no guarding or rebound.  Genitourinary:    Exam position: Supine.     Labia:        Right: No rash or lesion.        Left: No rash or lesion.      Vagina: Normal.     Cervix: No cervical motion tenderness, discharge or friability.     Uterus: Not deviated, not enlarged, not fixed and not tender.      Adnexa:        Right: No mass or tenderness.         Left: No mass or tenderness.    Musculoskeletal:        General: No swelling. Normal range of motion.     Cervical back: Neck supple.  Lymphadenopathy:     Cervical: No cervical adenopathy.  Skin:    General: Skin is warm and dry.     Findings: No rash.  Neurological:     Mental Status: She is alert and oriented to person, place, and time.     Coordination: Coordination normal.  Psychiatric:        Behavior: Behavior normal.       Assessment & Plan:   Problem List Items Addressed This Visit       Genitourinary   Atrophic vaginitis   Relevant Medications   estradiol (ESTRACE) 0.1 MG/GM vaginal cream   Other Relevant Orders   CBC with Differential/Platelet   CMP14+EGFR   Lipid panel   TSH     Other   Hyperlipidemia - Primary   Relevant Medications   rosuvastatin (CRESTOR) 10 MG tablet   Other Relevant Orders   CBC with Differential/Platelet   CMP14+EGFR   Lipid panel   TSH   Other Visit Diagnoses     Physical exam       Relevant Orders   CBC with Differential/Platelet   CMP14+EGFR   Lipid panel   TSH       Will check blood work today.  Continue current medicine.  No changes. Follow up plan: Return in about 1 year (around 10/29/2023), or if symptoms worsen or fail to improve, for Physical exam.  Counseling provided for all of the vaccine components Orders Placed This Encounter   Procedures   CBC with Differential/Platelet   CMP14+EGFR   Lipid panel   TSH    Caryl Pina, MD [facility] 10/28/2022, 3:05 PM

## 2022-10-29 LAB — CMP14+EGFR
ALT: 14 IU/L (ref 0–32)
AST: 20 IU/L (ref 0–40)
Albumin/Globulin Ratio: 2.3 — ABNORMAL HIGH (ref 1.2–2.2)
Albumin: 4.6 g/dL (ref 3.8–4.9)
Alkaline Phosphatase: 72 IU/L (ref 44–121)
BUN/Creatinine Ratio: 18 (ref 9–23)
BUN: 16 mg/dL (ref 6–24)
Bilirubin Total: 0.3 mg/dL (ref 0.0–1.2)
CO2: 25 mmol/L (ref 20–29)
Calcium: 9.2 mg/dL (ref 8.7–10.2)
Chloride: 103 mmol/L (ref 96–106)
Creatinine, Ser: 0.91 mg/dL (ref 0.57–1.00)
Globulin, Total: 2 g/dL (ref 1.5–4.5)
Glucose: 101 mg/dL — ABNORMAL HIGH (ref 70–99)
Potassium: 4 mmol/L (ref 3.5–5.2)
Sodium: 143 mmol/L (ref 134–144)
Total Protein: 6.6 g/dL (ref 6.0–8.5)
eGFR: 74 mL/min/{1.73_m2} (ref >59)

## 2022-10-29 LAB — CBC WITH DIFFERENTIAL/PLATELET
Basophils Absolute: 0 10*3/uL (ref 0.0–0.2)
Basos: 1 %
EOS (ABSOLUTE): 0.1 10*3/uL (ref 0.0–0.4)
Eos: 1 %
Hematocrit: 38.8 % (ref 34.0–46.6)
Hemoglobin: 12.8 g/dL (ref 11.1–15.9)
Immature Grans (Abs): 0 10*3/uL (ref 0.0–0.1)
Immature Granulocytes: 0 %
Lymphocytes Absolute: 2.1 10*3/uL (ref 0.7–3.1)
Lymphs: 38 %
MCH: 29.3 pg (ref 26.6–33.0)
MCHC: 33 g/dL (ref 31.5–35.7)
MCV: 89 fL (ref 79–97)
Monocytes Absolute: 0.4 10*3/uL (ref 0.1–0.9)
Monocytes: 8 %
Neutrophils Absolute: 2.9 10*3/uL (ref 1.4–7.0)
Neutrophils: 52 %
Platelets: 182 10*3/uL (ref 150–450)
RBC: 4.37 x10E6/uL (ref 3.77–5.28)
RDW: 12.3 % (ref 11.7–15.4)
WBC: 5.6 10*3/uL (ref 3.4–10.8)

## 2022-10-29 LAB — TSH: TSH: 2.03 u[IU]/mL (ref 0.450–4.500)

## 2022-10-29 LAB — LIPID PANEL
Chol/HDL Ratio: 3.5 ratio (ref 0.0–4.4)
Cholesterol, Total: 164 mg/dL (ref 100–199)
HDL: 47 mg/dL (ref >39)
LDL Chol Calc (NIH): 71 mg/dL (ref 0–99)
Triglycerides: 291 mg/dL — ABNORMAL HIGH (ref 0–149)
VLDL Cholesterol Cal: 46 mg/dL — ABNORMAL HIGH (ref 5–40)

## 2022-11-23 ENCOUNTER — Other Ambulatory Visit: Payer: Self-pay | Admitting: Family Medicine

## 2022-11-23 DIAGNOSIS — N952 Postmenopausal atrophic vaginitis: Secondary | ICD-10-CM

## 2023-01-21 ENCOUNTER — Other Ambulatory Visit: Payer: Self-pay | Admitting: Family Medicine

## 2023-01-21 DIAGNOSIS — Z1231 Encounter for screening mammogram for malignant neoplasm of breast: Secondary | ICD-10-CM

## 2023-02-03 ENCOUNTER — Ambulatory Visit
Admission: RE | Admit: 2023-02-03 | Discharge: 2023-02-03 | Disposition: A | Discharge status: Home / Self Care | Payer: PRIVATE HEALTH INSURANCE | Source: Ambulatory Visit | Attending: Family Medicine | Admitting: Family Medicine

## 2023-02-03 DIAGNOSIS — Z1231 Encounter for screening mammogram for malignant neoplasm of breast: Secondary | ICD-10-CM

## 2023-10-30 ENCOUNTER — Encounter: Payer: Self-pay | Admitting: Family Medicine

## 2023-10-30 ENCOUNTER — Ambulatory Visit: Payer: No Typology Code available for payment source | Admitting: Family Medicine

## 2023-10-30 ENCOUNTER — Other Ambulatory Visit (HOSPITAL_COMMUNITY)
Admission: RE | Admit: 2023-10-30 | Discharge: 2023-10-30 | Disposition: A | Discharge status: Home / Self Care | Payer: No Typology Code available for payment source | Source: Ambulatory Visit | Attending: Family Medicine | Admitting: Family Medicine

## 2023-10-30 VITALS — BP 128/67 | HR 77 | Ht 60.0 in | Wt 113.0 lb

## 2023-10-30 DIAGNOSIS — Z01419 Encounter for gynecological examination (general) (routine) without abnormal findings: Secondary | ICD-10-CM | POA: Insufficient documentation

## 2023-10-30 DIAGNOSIS — E782 Mixed hyperlipidemia: Secondary | ICD-10-CM | POA: Diagnosis not present

## 2023-10-30 DIAGNOSIS — N952 Postmenopausal atrophic vaginitis: Secondary | ICD-10-CM

## 2023-10-30 MED ORDER — ESTRADIOL 0.1 MG/GM VA CREA: 1.0000 | TOPICAL_CREAM | Freq: Every day | VAGINAL | 3 refills | Status: AC

## 2023-10-30 MED ORDER — ROSUVASTATIN CALCIUM 10 MG PO TABS: 10.0000 mg | ORAL_TABLET | Freq: Every day | ORAL | 3 refills | Status: DC

## 2023-10-30 NOTE — Progress Notes (Signed)
 BP 128/67   Pulse 77   Ht 5' (1.524 m)   Wt 113 lb (51.3 kg)   LMP 03/14/2014   SpO2 97%   BMI 22.07 kg/m    Subjective:   Patient ID: Carrie Mccoy, female    DOB: 1966/08/20, 57 y.o.   MRN: [id number]  HPI: Carrie Mccoy is a 58 y.o. female presenting on 10/30/2023 for Medical Management of Chronic Issues (CPE with breast and pap exam)  Well Woman Exam Patient comes in today for a well woman exam. She denies any fevers, chills, chest pain, shortness or breath, abdominal pain, dysuria, myalgias, headaches, or weakness.  Hyperlipidemia Patient is currently taking rosuvastatin  and fish oil. She denies myalgias or weakness. She does not have a history of liver damage from it.   Vaginal atrophy Patient is currently using estradiol  cream three times weekly. She reports this cream is managing her symptoms well. She denies vaginal pain, dysuria, or urinary frequency.  Patient also reports a 59-month history of dry, itchy skin on her left thigh. She denies any rash or lesions. She has tried Aquaphor, which has provided relief. She does take warmer/hot showers.  Relevant past medical, surgical, family and social history reviewed and updated as indicated. Interim medical history since our last visit reviewed. Allergies and medications reviewed and updated.  Review of Systems  Constitutional:  Negative for chills and fever.  HENT:  Negative for congestion, sinus pressure and sore throat.   Eyes:  Negative for visual disturbance.  Respiratory:  Negative for cough, chest tightness and shortness of breath.   Cardiovascular:  Negative for chest pain, palpitations and leg swelling.  Gastrointestinal:  Negative for abdominal distention, abdominal pain, constipation and diarrhea.  Genitourinary:  Negative for difficulty urinating, dysuria, hematuria and vaginal pain.  Musculoskeletal:  Negative for gait problem and myalgias.  Skin:        Dry skin  Neurological:  Negative for dizziness,  weakness and headaches.  Psychiatric/Behavioral:  Negative for dysphoric mood. The patient is not nervous/anxious.     Per HPI unless specifically indicated above   Allergies as of 10/30/2023   No Known Allergies      Medication List        Accurate as of October 30, 2023  3:37 PM. If you have any questions, ask your nurse or doctor.          estradiol  0.1 MG/GM vaginal cream Commonly known as: ESTRACE  Place 1 Applicatorful vaginally at bedtime.   FISH OIL CONCENTRATE PO Take by mouth.   multivitamin tablet Take 1 tablet by mouth daily.   NON FORMULARY Vitamin D    One daily   rosuvastatin  10 MG tablet Commonly known as: CRESTOR  Take 1 tablet (10 mg total) by mouth daily.   VITAMIN B-12 PO Take 1 tablet by mouth daily.   VITAMIN E PO Take 1 tablet by mouth daily.         Objective:   BP 128/67   Pulse 77   Ht 5' (1.524 m)   Wt 113 lb (51.3 kg)   LMP 03/14/2014   SpO2 97%   BMI 22.07 kg/m   Wt Readings from Last 3 Encounters:  10/30/23 113 lb (51.3 kg)  10/28/22 112 lb (50.8 kg)  10/25/21 111 lb (50.3 kg)    Physical Exam Vitals and nursing note reviewed. Exam conducted with a chaperone present.  Constitutional:      Appearance: Normal appearance. She is normal weight.  HENT:  Head: Normocephalic and atraumatic.     Right Ear: Tympanic membrane, ear canal and external ear normal.     Left Ear: Tympanic membrane, ear canal and external ear normal.     Nose: Nose normal.     Mouth/Throat:     Mouth: Mucous membranes are moist.     Pharynx: Oropharynx is clear. No oropharyngeal exudate or posterior oropharyngeal erythema.  Eyes:     Conjunctiva/sclera: Conjunctivae normal.  Cardiovascular:     Rate and Rhythm: Normal rate and regular rhythm.     Heart sounds: Normal heart sounds.  Pulmonary:     Effort: Pulmonary effort is normal.     Breath sounds: Normal breath sounds. No wheezing or rales.  Chest:  Breasts:    Right: Normal. No  mass, nipple discharge, skin change or tenderness.     Left: Normal. No mass, nipple discharge, skin change or tenderness.  Abdominal:     General: Abdomen is flat. There is no distension.     Palpations: Abdomen is soft.     Tenderness: There is no abdominal tenderness.  Genitourinary:    General: Normal vulva.     Exam position: Lithotomy position.     Pubic Area: No rash.      Labia:        Right: No rash, tenderness or lesion.        Left: No rash, tenderness or lesion.      Urethra: No prolapse.     Cervix: Normal. No friability or lesion.     Uterus: Normal. Not tender.      Adnexa: Right adnexa normal and left adnexa normal.       Right: No mass, tenderness or fullness.         Left: No mass, tenderness or fullness.       Comments: Vaginal atrophy present Musculoskeletal:     Cervical back: Normal range of motion and neck supple. No tenderness.     Right lower leg: No edema.     Left lower leg: No edema.  Lymphadenopathy:     Upper Body:     Right upper body: No supraclavicular, axillary or pectoral adenopathy.     Left upper body: No supraclavicular, axillary or pectoral adenopathy.  Skin:    General: Skin is warm and dry.     Coloration: Skin is ashen.     Findings: No lesion or rash.  Neurological:     Mental Status: She is alert and oriented to person, place, and time.  Psychiatric:        Mood and Affect: Mood normal.        Behavior: Behavior normal.        Thought Content: Thought content normal.        Judgment: Judgment normal.    Assessment & Plan:   Problem List Items Addressed This Visit       Genitourinary   Atrophic vaginitis   Relevant Medications   estradiol  (ESTRACE ) 0.1 MG/GM vaginal cream     Other   Hyperlipidemia   Relevant Medications   rosuvastatin  (CRESTOR ) 10 MG tablet   Other Relevant Orders   CBC with Differential/Platelet   CMP14+EGFR   Lipid panel   Other Visit Diagnoses       Well woman exam with routine gynecological  exam    -  Primary   Relevant Orders   Cytology - PAP([facility])   CBC with Differential/Platelet   CMP14+EGFR   Lipid panel  Patient is doing well. Will not make any medication changes. Completed pap smear during visit. Patient refused other health maintenance including Tdap and shingles vaccines. Recommended patient use Aquaphor or other ointments/creams for her dry skin, especially right after showering. Will recheck lipid panel, CBC, and CMP today.   Follow up plan: Return in about 1 year (around 10/29/2024), or if symptoms worsen or fail to improve, for Well woman exam.  Counseling provided for all of the vaccine components Orders Placed This Encounter  Procedures   CBC with Differential/Platelet   CMP14+EGFR   Lipid panel    Vernell Music, Medical Student [facility] 10/30/2023, 3:37 PM  I was personally present for all components of the history, physical exam and/or medical decision making.  I agree with the documentation performed by the student and agree with assessment and plan above.  Vaginal atrophy noted on exam patient will try estrogen cream and will use Aquaphor for dry skin. Fonda Levins, MD [facility] 11/13/2023, 9:54 AM

## 2023-10-31 ENCOUNTER — Other Ambulatory Visit: Payer: Self-pay | Admitting: Family Medicine

## 2023-10-31 LAB — CMP14+EGFR
ALT: 15 [IU]/L (ref 0–32)
AST: 22 [IU]/L (ref 0–40)
Albumin: 4.6 g/dL (ref 3.8–4.9)
Alkaline Phosphatase: 69 [IU]/L (ref 44–121)
BUN/Creatinine Ratio: 22 (ref 9–23)
BUN: 19 mg/dL (ref 6–24)
Bilirubin Total: 0.4 mg/dL (ref 0.0–1.2)
CO2: 26 mmol/L (ref 20–29)
Calcium: 9.3 mg/dL (ref 8.7–10.2)
Chloride: 105 mmol/L (ref 96–106)
Creatinine, Ser: 0.87 mg/dL (ref 0.57–1.00)
Globulin, Total: 2.4 g/dL (ref 1.5–4.5)
Glucose: 93 mg/dL (ref 70–99)
Potassium: 4 mmol/L (ref 3.5–5.2)
Sodium: 144 mmol/L (ref 134–144)
Total Protein: 7 g/dL (ref 6.0–8.5)
eGFR: 77 mL/min/{1.73_m2} (ref >59)

## 2023-10-31 LAB — CBC WITH DIFFERENTIAL/PLATELET
Basophils Absolute: 0 10*3/uL (ref 0.0–0.2)
Basos: 1 %
EOS (ABSOLUTE): 0.1 10*3/uL (ref 0.0–0.4)
Eos: 1 %
Hematocrit: 38.7 % (ref 34.0–46.6)
Hemoglobin: 12.8 g/dL (ref 11.1–15.9)
Immature Grans (Abs): 0 10*3/uL (ref 0.0–0.1)
Immature Granulocytes: 0 %
Lymphocytes Absolute: 2.2 10*3/uL (ref 0.7–3.1)
Lymphs: 35 %
MCH: 29.4 pg (ref 26.6–33.0)
MCHC: 33.1 g/dL (ref 31.5–35.7)
MCV: 89 fL (ref 79–97)
Monocytes Absolute: 0.4 10*3/uL (ref 0.1–0.9)
Monocytes: 7 %
Neutrophils Absolute: 3.4 10*3/uL (ref 1.4–7.0)
Neutrophils: 56 %
Platelets: 186 10*3/uL (ref 150–450)
RBC: 4.35 x10E6/uL (ref 3.77–5.28)
RDW: 12 % (ref 11.7–15.4)
WBC: 6.2 10*3/uL (ref 3.4–10.8)

## 2023-10-31 LAB — LIPID PANEL
Chol/HDL Ratio: 3.1 {ratio} (ref 0.0–4.4)
Cholesterol, Total: 160 mg/dL (ref 100–199)
HDL: 52 mg/dL (ref >39)
LDL Chol Calc (NIH): 78 mg/dL (ref 0–99)
Triglycerides: 177 mg/dL — ABNORMAL HIGH (ref 0–149)
VLDL Cholesterol Cal: 30 mg/dL (ref 5–40)

## 2023-11-06 ENCOUNTER — Encounter: Payer: Self-pay | Admitting: Family Medicine

## 2023-11-07 LAB — CYTOLOGY - PAP
Adequacy: ABSENT
Diagnosis: NEGATIVE

## 2024-02-03 ENCOUNTER — Other Ambulatory Visit: Payer: Self-pay | Admitting: Family Medicine

## 2024-02-03 DIAGNOSIS — Z1231 Encounter for screening mammogram for malignant neoplasm of breast: Secondary | ICD-10-CM

## 2024-02-18 ENCOUNTER — Ambulatory Visit
Admission: RE | Admit: 2024-02-18 | Discharge: 2024-02-18 | Disposition: A | Discharge status: Home / Self Care | Payer: PRIVATE HEALTH INSURANCE | Source: Ambulatory Visit | Attending: Family Medicine | Admitting: Family Medicine

## 2024-02-18 DIAGNOSIS — Z1231 Encounter for screening mammogram for malignant neoplasm of breast: Secondary | ICD-10-CM

## 2024-03-05 ENCOUNTER — Telehealth: Payer: Self-pay | Admitting: Family Medicine

## 2024-03-05 DIAGNOSIS — Z0279 Encounter for issue of other medical certificate: Secondary | ICD-10-CM

## 2024-03-05 NOTE — Telephone Encounter (Signed)
 Arsenia Goracke (husband) dropped off Cpe Form for [employer] forms to be completed and signed.  Form Fee Paid? (Y/N)       yes     If NO, form is placed on front office manager desk to hold until payment received. If YES, then form will be placed in the RX/HH Nurse Coordinators box for completion.  Form will not be processed until payment is received   Call pt, when ready!

## 2024-03-10 NOTE — Telephone Encounter (Signed)
 Aware PE form ready

## 2024-10-16 ENCOUNTER — Other Ambulatory Visit: Payer: Self-pay | Admitting: Family Medicine

## 2024-11-01 ENCOUNTER — Encounter: Payer: Self-pay | Admitting: Family Medicine
